# Patient Record
Sex: Female | Born: 1980 | Race: Black or African American | Hispanic: No | Marital: Single | Smoking: Never smoker
Health system: Southern US, Community
[De-identification: ages and names within clinical notes are randomized; demographics above are authoritative.]

## PROBLEM LIST (undated history)

## (undated) DIAGNOSIS — J45909 Unspecified asthma, uncomplicated: Secondary | ICD-10-CM

---

## 2015-10-23 ENCOUNTER — Emergency Department (HOSPITAL_COMMUNITY)
Admission: EM | Admit: 2015-10-23 | Discharge: 2015-10-23 | Disposition: A | Discharge status: Home / Self Care | Payer: BLUE CROSS/BLUE SHIELD | Attending: Emergency Medicine | Admitting: Emergency Medicine

## 2015-10-23 ENCOUNTER — Encounter (HOSPITAL_COMMUNITY): Payer: Self-pay | Admitting: Oncology

## 2015-10-23 ENCOUNTER — Encounter (HOSPITAL_COMMUNITY): Payer: Self-pay

## 2015-10-23 ENCOUNTER — Emergency Department (HOSPITAL_COMMUNITY)
Admission: EM | Admit: 2015-10-23 | Discharge: 2015-10-24 | Disposition: A | Discharge status: Home / Self Care | Payer: BLUE CROSS/BLUE SHIELD | Attending: Emergency Medicine | Admitting: Emergency Medicine

## 2015-10-23 DIAGNOSIS — M542 Cervicalgia: Secondary | ICD-10-CM | POA: Diagnosis present

## 2015-10-23 DIAGNOSIS — Y9389 Activity, other specified: Secondary | ICD-10-CM | POA: Insufficient documentation

## 2015-10-23 DIAGNOSIS — M546 Pain in thoracic spine: Secondary | ICD-10-CM

## 2015-10-23 DIAGNOSIS — Y999 Unspecified external cause status: Secondary | ICD-10-CM | POA: Diagnosis not present

## 2015-10-23 DIAGNOSIS — Y92410 Unspecified street and highway as the place of occurrence of the external cause: Secondary | ICD-10-CM | POA: Insufficient documentation

## 2015-10-23 DIAGNOSIS — J45909 Unspecified asthma, uncomplicated: Secondary | ICD-10-CM | POA: Diagnosis not present

## 2015-10-23 HISTORY — DX: Unspecified asthma, uncomplicated: J45.909

## 2015-10-23 MED ORDER — OXYCODONE-ACETAMINOPHEN 5-325 MG PO TABS
1.0000 | ORAL_TABLET | ORAL | Status: DC | PRN
  Filled 2015-10-23: qty 1
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
## 2015-10-24 DIAGNOSIS — M546 Pain in thoracic spine: Secondary | ICD-10-CM | POA: Diagnosis not present

## 2015-10-24 MED ORDER — METHOCARBAMOL 500 MG PO TABS: 500.0000 mg | ORAL_TABLET | Freq: Two times a day (BID) | ORAL | Status: DC

## 2015-10-24 MED ORDER — DIAZEPAM 5 MG PO TABS
5.0000 mg | ORAL_TABLET | Freq: Once | ORAL | Status: AC
  Administered 2015-10-24: 5 mg via ORAL
  Filled 2015-10-24: qty 1

## 2015-10-24 MED ORDER — KETOROLAC TROMETHAMINE 30 MG/ML IJ SOLN
30.0000 mg | Freq: Once | INTRAMUSCULAR | Status: AC
  Administered 2015-10-24: 30 mg via INTRAMUSCULAR
  Filled 2015-10-24: qty 1

## 2015-10-24 NOTE — ED Notes (Signed)
Pt reports understanding of discharge information. No questions at time of discharge 
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
## 2015-10-24 NOTE — Discharge Instructions (Signed)
Please take your medications as prescribed and as we discussed. Do not take this medication before driving or operating machinery as it can make you very tired. Continue taking your previously prescribed ibuprofen. Follow-up with your doctor as needed. Return to ED for new or worsening symptoms.  Thoracic Strain A thoracic strain, which is sometimes called a mid-back strain, is an injury to the muscles or tendons that attach to the upper part of your back behind your chest. This type of injury occurs when a muscle is overstretched or overloaded.  Thoracic strains can range from mild to severe. Mild strains may involve stretching a muscle or tendon without tearing it. These injuries may heal in 1-2 weeks. More severe strains involve tearing of muscle fibers or tendons. These will cause more pain and may take 6-8 weeks to heal. CAUSES This condition may be caused by:  An injury in which a sudden force is placed on the muscle.  Exercising without properly warming up.  Overuse of the muscle.  Improper form during certain movements.  Other injuries that surround or cause stress on the mid-back, causing a strain on the muscles. In some cases, the cause may not be known. RISK FACTORS This injury is more common in:  Athletes.  People with obesity. SYMPTOMS The main symptom of this condition is pain, especially with movement. Other symptoms include:  Bruising.  Swelling.  Spasm. DIAGNOSIS This condition may be diagnosed with a physical exam. X-rays may be taken to check for a fracture. TREATMENT This condition may be treated with:  Resting and icing the injured area.  Physical therapy. This will involve doing stretching and strengthening exercises.  Medicines for pain and inflammation. HOME CARE INSTRUCTIONS  Rest as needed. Follow instructions from your health care provider about any restrictions on activity.  If directed, apply ice to the injured area:  Put ice in a plastic  bag.  Place a towel between your skin and the bag.  Leave the ice on for 20 minutes, 2-3 times per day.  Take over-the-counter and prescription medicines only as told by your health care provider.  Begin doing exercises as told by your health care provider or physical therapist.  Always warm up properly before physical activity or sports.  Bend your knees before you lift heavy objects.  Keep all follow-up visits as told by your health care provider. This is important. SEEK MEDICAL CARE IF:  Your pain is not helped by medicine.  Your pain, bruising, or swelling is getting worse.  You have a fever. SEEK IMMEDIATE MEDICAL CARE IF:  You have shortness of breath.  You have chest pain.  You develop numbness or weakness in your legs.  You have involuntary loss of urine (urinary incontinence).   This information is not intended to replace advice given to you by your health care provider. Make sure you discuss any questions you have with your health care provider.   Document Released: 06/17/2003 Document Revised: 12/16/2014 Document Reviewed: 05/21/2014 Elsevier Interactive Patient Education [employer]2016 Elsevier Inc.

## 2017-05-30 ENCOUNTER — Other Ambulatory Visit: Payer: Self-pay

## 2017-05-30 ENCOUNTER — Ambulatory Visit (HOSPITAL_COMMUNITY)
Admission: EM | Admit: 2017-05-30 | Discharge: 2017-05-30 | Disposition: A | Discharge status: Home / Self Care | Payer: 59 | Attending: Family Medicine | Admitting: Family Medicine

## 2017-05-30 ENCOUNTER — Encounter (HOSPITAL_COMMUNITY): Payer: Self-pay

## 2017-05-30 DIAGNOSIS — K0889 Other specified disorders of teeth and supporting structures: Secondary | ICD-10-CM

## 2017-05-30 MED ORDER — AMOXICILLIN-POT CLAVULANATE 875-125 MG PO TABS: 1.0000 | ORAL_TABLET | Freq: Two times a day (BID) | ORAL | 0 refills | Status: DC

## 2017-05-30 MED ORDER — HYDROCODONE-ACETAMINOPHEN 5-325 MG PO TABS: 1.0000 | ORAL_TABLET | Freq: Four times a day (QID) | ORAL | 0 refills | Status: DC | PRN

## 2017-05-30 NOTE — Discharge Instructions (Signed)
Be aware, pain medications may cause drowsiness. Please do not drive, operate heavy machinery or make important decisions while on this medication, it can cloud your judgement.  

## 2017-05-30 NOTE — ED Triage Notes (Signed)
[facility], pt also complains of ear pain

## 2017-06-04 NOTE — ED Provider Notes (Signed)
  [facility]   [id number] 05/30/17 Arrival Time: 1858  ASSESSMENT & PLAN:  1. Pain, dental     Meds ordered this encounter  Medications  . amoxicillin-clavulanate (AUGMENTIN) 875-125 MG tablet    Sig: Take 1 tablet by mouth every 12 (twelve) hours.    Dispense:  20 tablet    Refill:  0  . HYDROcodone-acetaminophen (NORCO/VICODIN) 5-325 MG tablet    Sig: Take 1 tablet by mouth every 6 (six) hours as needed for moderate pain or severe pain.    Dispense:  8 tablet    Refill:  0   [place] Controlled Substances Registry consulted for this patient. I feel the risk/benefit ratio today is favorable for proceeding with this prescription for a controlled substance. Medication sedation precautions given.  Dental resource written instructions given. She will schedule dental evaluation as soon as possible.  Reviewed expectations re: course of current medical issues. Questions answered. Outlined signs and symptoms indicating need for more acute intervention. Patient verbalized understanding. After Visit Summary given.   SUBJECTIVE:  Savannah Ryan is a 37 y.o. female who reports gradual onset of left upper dental pain described as throbbing with sharp exacerbations. Present for approx 1 week. "Kind of feel it in my left ear also." Afebrile. Tolerating PO intake but reports pain with chewing. Normal swallowing. She does not see a dentist regularly. No neck swelling or pain. OTC analgesics without relief.  ROS: As per HPI.  OBJECTIVE:  Vitals:   05/30/17 1958 05/30/17 2000 05/30/17 2002  BP: (!) 163/102  (!) 154/93  Pulse: 68  69  Resp: 16  16  Temp: 98.3 F (36.8 C)    TempSrc: Oral    SpO2: 99% 99%     General appearance: alert; no distress HENT: normocephalic; atraumatic; dentition: fair; gingival hypertrophy over left upper gums without areas of fluctucance Neck: supple without LAD Lungs: normal respirations Skin: warm and dry Psychological: alert and cooperative; normal  mood and affect  No Known Allergies  Past Medical History:  Diagnosis Date  . Asthma    Social History   Socioeconomic History  . Marital status: Single    Spouse name: Not on file  . Number of children: Not on file  . Years of education: Not on file  . Highest education level: Not on file  Social Needs  . Financial resource strain: Not on file  . Food insecurity - worry: Not on file  . Food insecurity - inability: Not on file  . Transportation needs - medical: Not on file  . Transportation needs - non-medical: Not on file  Occupational History  . Not on file  Tobacco Use  . Smoking status: Never Smoker  . Smokeless tobacco: Never Used  Substance and Sexual Activity  . Alcohol use: Yes  . Drug use: No  . Sexual activity: No  Other Topics Concern  . Not on file  Social History Narrative  . Not on file   History reviewed. No pertinent surgical history.   Mardella LaymanHagler, Ameia Morency, MD 06/04/17 [phone]

## 2019-09-28 ENCOUNTER — Encounter (HOSPITAL_COMMUNITY): Payer: Self-pay | Admitting: Emergency Medicine

## 2019-09-28 ENCOUNTER — Ambulatory Visit (HOSPITAL_COMMUNITY)
Admission: AD | Admit: 2019-09-28 | Discharge: 2019-09-29 | Disposition: A | Discharge status: Home / Self Care | Payer: 59 | Attending: Obstetrics and Gynecology | Admitting: Obstetrics and Gynecology

## 2019-09-28 ENCOUNTER — Emergency Department (HOSPITAL_COMMUNITY): Payer: 59

## 2019-09-28 ENCOUNTER — Other Ambulatory Visit: Payer: Self-pay

## 2019-09-28 DIAGNOSIS — Z20822 Contact with and (suspected) exposure to covid-19: Secondary | ICD-10-CM | POA: Diagnosis not present

## 2019-09-28 DIAGNOSIS — O00102 Left tubal pregnancy without intrauterine pregnancy: Secondary | ICD-10-CM | POA: Diagnosis not present

## 2019-09-28 DIAGNOSIS — O99519 Diseases of the respiratory system complicating pregnancy, unspecified trimester: Secondary | ICD-10-CM | POA: Insufficient documentation

## 2019-09-28 DIAGNOSIS — Z3A00 Weeks of gestation of pregnancy not specified: Secondary | ICD-10-CM | POA: Insufficient documentation

## 2019-09-28 DIAGNOSIS — O009 Unspecified ectopic pregnancy without intrauterine pregnancy: Secondary | ICD-10-CM

## 2019-09-28 DIAGNOSIS — N83201 Unspecified ovarian cyst, right side: Secondary | ICD-10-CM | POA: Insufficient documentation

## 2019-09-28 DIAGNOSIS — J45909 Unspecified asthma, uncomplicated: Secondary | ICD-10-CM | POA: Diagnosis not present

## 2019-09-28 DIAGNOSIS — N939 Abnormal uterine and vaginal bleeding, unspecified: Secondary | ICD-10-CM | POA: Diagnosis present

## 2019-09-28 DIAGNOSIS — O3480 Maternal care for other abnormalities of pelvic organs, unspecified trimester: Secondary | ICD-10-CM | POA: Diagnosis not present

## 2019-09-28 DIAGNOSIS — R1032 Left lower quadrant pain: Secondary | ICD-10-CM | POA: Diagnosis present

## 2019-09-28 LAB — LIPASE, BLOOD: Lipase: 27 U/L (ref 11–51)

## 2019-09-28 LAB — COMPREHENSIVE METABOLIC PANEL
ALT: 12 U/L (ref 0–44)
AST: 21 U/L (ref 15–41)
Albumin: 4.1 g/dL (ref 3.5–5.0)
Alkaline Phosphatase: 52 U/L (ref 38–126)
Anion gap: 12 (ref 5–15)
BUN: 10 mg/dL (ref 6–20)
CO2: 22 mmol/L (ref 22–32)
Calcium: 8.9 mg/dL (ref 8.9–10.3)
Chloride: 103 mmol/L (ref 98–111)
Creatinine, Ser: 0.85 mg/dL (ref 0.44–1.00)
GFR calc Af Amer: >60 mL/min (ref >60)
GFR calc non Af Amer: >60 mL/min (ref >60)
Glucose, Bld: 80 mg/dL (ref 70–99)
Potassium: 3.5 mmol/L (ref 3.5–5.1)
Sodium: 137 mmol/L (ref 135–145)
Total Bilirubin: 1.1 mg/dL (ref 0.3–1.2)
Total Protein: 7.8 g/dL (ref 6.5–8.1)

## 2019-09-28 LAB — CBC
HCT: 35.8 % — ABNORMAL LOW (ref 36.0–46.0)
Hemoglobin: 12.1 g/dL (ref 12.0–15.0)
MCH: 31.3 pg (ref 26.0–34.0)
MCHC: 33.8 g/dL (ref 30.0–36.0)
MCV: 92.5 fL (ref 80.0–100.0)
Platelets: 253 10*3/uL (ref 150–400)
RBC: 3.87 MIL/uL (ref 3.87–5.11)
RDW: 12.4 % (ref 11.5–15.5)
WBC: 10.5 10*3/uL (ref 4.0–10.5)
nRBC: 0 % (ref 0.0–0.2)

## 2019-09-28 LAB — I-STAT BETA HCG BLOOD, ED (MC, WL, AP ONLY): I-stat hCG, quantitative: >2000 m[IU]/mL — ABNORMAL HIGH (ref <5)

## 2019-09-28 LAB — WET PREP, GENITAL
Clue Cells Wet Prep HPF POC: NONE SEEN
Sperm: NONE SEEN
Trich, Wet Prep: NONE SEEN
WBC, Wet Prep HPF POC: NONE SEEN
Yeast Wet Prep HPF POC: NONE SEEN

## 2019-09-28 LAB — HCG, QUANTITATIVE, PREGNANCY: hCG, Beta Chain, Quant, S: 4013 m[IU]/mL — ABNORMAL HIGH (ref <5)

## 2019-09-28 LAB — TYPE AND SCREEN
ABO/RH(D): A POS
Antibody Screen: NEGATIVE

## 2019-09-28 MED ORDER — MORPHINE SULFATE (PF) 4 MG/ML IV SOLN
4.0000 mg | Freq: Once | INTRAVENOUS | Status: AC
  Administered 2019-09-28: 4 mg via INTRAVENOUS
  Filled 2019-09-28: qty 1

## 2019-09-28 MED ORDER — SODIUM CHLORIDE 0.9 % IV BOLUS
1000.0000 mL | Freq: Once | INTRAVENOUS | Status: AC
  Administered 2019-09-28: 1000 mL via INTRAVENOUS

## 2019-09-28 NOTE — ED Provider Notes (Cosign Needed)
[facility]-EMERGENCY DEPT Provider Note   CSN: [id number] Arrival date & time: 09/28/19  1554     History Chief Complaint  Patient presents with  . Vaginal Bleeding  . Abdominal Pain    Savannah Ryan is a 39 y.o. female who presents to ED with a chief complaint of left lower quadrant abdominal pain and vaginal bleeding.  States that she started with her usual menstrual cycle on 08/30/2019. Since then she has had persistent vaginal bleeding that will be intermittently heavier.  States that this current episode is similar to spotting.  1 week ago started having left lower quadrant abdominal pain that has been intermittent without specific aggravating or alleviating factor.  She has been taking ibuprofen but has not noticed much difference.  She does not currently have an OB/GYN provider.  Last sexual intercourse was 2 months ago, she is not concerned about STDs. She denies any vaginal discharge, dysuria, anticoagulant use, syncope, OCP use, history of ovarian cysts, history of ectopic pregnancy. She does report some fatigue and lightheadedness when she has increase in bleeding.  HPI     Past Medical History:  Diagnosis Date  . Asthma     There are no problems to display for this patient.   History reviewed. No pertinent surgical history.   OB History   No obstetric history on file.     No family history on file.  Social History   Tobacco Use  . Smoking status: Never Smoker  . Smokeless tobacco: Never Used  Substance Use Topics  . Alcohol use: Yes  . Drug use: No    Home Medications Prior to Admission medications   Medication Sig Start Date End Date Taking? Authorizing Provider  amoxicillin-clavulanate (AUGMENTIN) 875-125 MG tablet Take 1 tablet by mouth every 12 (twelve) hours. 05/30/17   Mardella Layman, MD  HYDROcodone-acetaminophen (NORCO/VICODIN) 5-325 MG tablet Take 1 tablet by mouth every 6 (six) hours as needed for moderate pain or severe pain.  05/30/17   Mardella Layman, MD  methocarbamol (ROBAXIN) 500 MG tablet Take 1 tablet (500 mg total) by mouth 2 (two) times daily. 10/24/15   Joycie Peek, PA-C    Allergies    Patient has no known allergies.  Review of Systems   Review of Systems  Constitutional: Negative for appetite change, chills and fever.  HENT: Negative for ear pain, rhinorrhea, sneezing and sore throat.   Eyes: Negative for photophobia and visual disturbance.  Respiratory: Negative for cough, chest tightness, shortness of breath and wheezing.   Cardiovascular: Negative for chest pain and palpitations.  Gastrointestinal: Positive for abdominal pain. Negative for blood in stool, constipation, diarrhea, nausea and vomiting.  Genitourinary: Positive for pelvic pain and vaginal bleeding. Negative for dysuria, hematuria and urgency.  Musculoskeletal: Negative for myalgias.  Skin: Negative for rash.  Neurological: Negative for dizziness, weakness and light-headedness.    Physical Exam Updated Vital Signs BP 122/74 (BP Location: Left Arm)   Pulse 79   Temp 98.5 F (36.9 C)   Resp 18   LMP 08/30/2019   SpO2 100%   Physical Exam Vitals and nursing note reviewed. Exam conducted with a chaperone present.  Constitutional:      General: She is not in acute distress.    Appearance: She is well-developed.  HENT:     Head: Normocephalic and atraumatic.     Nose: Nose normal.  Eyes:     General: No scleral icterus.       Left eye: No  discharge.     Conjunctiva/sclera: Conjunctivae normal.  Cardiovascular:     Rate and Rhythm: Normal rate and regular rhythm.     Heart sounds: Normal heart sounds. No murmur heard.  No friction rub. No gallop.   Pulmonary:     Effort: Pulmonary effort is normal. No respiratory distress.     Breath sounds: Normal breath sounds.  Abdominal:     General: Bowel sounds are normal. There is no distension.     Palpations: Abdomen is soft.     Tenderness: There is abdominal tenderness  in the left lower quadrant. There is no guarding.  Genitourinary:    Vagina: Bleeding present.     Cervix: No cervical motion tenderness.     Uterus: Not enlarged and not tender.      Adnexa:        Right: Tenderness present.        Left: Tenderness present.      Comments: Pelvic exam: normal external genitalia without evidence of trauma. VULVA: normal appearing vulva with no masses, tenderness or lesion. VAGINA: normal appearing vagina with normal color and discharge; moderate amount of blood in vaginal vault; no lesions. CERVIX: normal appearing cervix without lesions, cervical motion tenderness absent, cervical os closed with out purulent discharge; No vaginal discharge. Wet prep and DNA probe for chlamydia and GC obtained.   ADNEXA: normal adnexa in size, TTP of the L adnexal area, mild tenderness in the R adnexal area. No masses palpated. UTERUS: uterus is normal size, shape, consistency and nontender.   Musculoskeletal:        General: Normal range of motion.     Cervical back: Normal range of motion and neck supple.  Skin:    General: Skin is warm and dry.     Findings: No rash.  Neurological:     Mental Status: She is alert.     Motor: No abnormal muscle tone.     Coordination: Coordination normal.     ED Results / Procedures / Treatments   Labs (all labs ordered are listed, but only abnormal results are displayed) Labs Reviewed  CBC - Abnormal; Notable for the following components:      Result Value   HCT 35.8 (*)    All other components within normal limits  WET PREP, GENITAL  LIPASE, BLOOD  COMPREHENSIVE METABOLIC PANEL  URINALYSIS, ROUTINE W REFLEX MICROSCOPIC  I-STAT BETA HCG BLOOD, ED ([facility], [facility], [facility] ONLY)  TYPE AND SCREEN  GC/CHLAMYDIA PROBE AMP ([place]) NOT AT [facility]    EKG None  Radiology No results found.  Procedures Procedures (including critical care time)  Medications Ordered in ED Medications  morphine 4 MG/ML injection 4 mg (4 mg  Intravenous Given 09/28/19 2034)  sodium chloride 0.9 % bolus 1,000 mL (1,000 mLs Intravenous New Bag/Given 09/28/19 2034)    ED Course  I have reviewed the triage vital signs and the nursing notes.  Pertinent labs & imaging results that were available during my care of the patient were reviewed by me and considered in my medical decision making (see chart for details).    MDM Rules/Calculators/A&P                          39 year old female presenting to the ED for persistent vaginal bleeding for the past month as well as left lower quadrant pain for the past week. States that bleeding will intermittently lighten and then increase.  Pain has been  intermittent. She does report some lightheadedness and fatigue when her bleeding worsens. She has tenderness palpation of the left lower quadrant on exam without rebound or guarding.  Will need to obtain lab work, complete pelvic exam and reassess. Will attempt to control pain and give IV fluids to help with lightheadedness.  Pelvic exam revealed bilateral adnexal tenderness L > R with a moderate amount of blood in vaginal vault. No CMT. Labwork pending. Will need pelvic u/s to r/o torsion or other structural cause of her symptoms. Consider hormonal tx to stop bleeding if indicated. Will need outpatient f/u if her workup is reassuring. Care handed off to oncoming provider pending remainder of workup.  Portions of this note were generated with Scientist, clinical (histocompatibility and immunogenetics). Dictation errors may occur despite best attempts at proofreading.   Final Clinical Impression(s) / ED Diagnoses Final diagnoses:  Abnormal vaginal bleeding    Rx / DC Orders ED Discharge Orders    None       Dietrich Pates, PA-C 09/28/19 2049

## 2019-09-28 NOTE — ED Triage Notes (Signed)
Patient reports LLQ pain with vaginal bleeding x2 weeks. Denies N/V/D. Denies urinary sx.

## 2019-09-28 NOTE — MAU Note (Signed)
Bleeding after cycle, and pain that felt like cramps about a week ago and it got worse and worse.

## 2019-09-28 NOTE — ED Provider Notes (Addendum)
Care assumed at shift change from [facility].  See her note for full HPI and work-up.  Briefly, patient presenting with 2 weeks of vaginal bleeding and left lower quadrant abdominal pain.  LMP 08/30/2019.  Pelvic exam with minor amount of blood.  Pending pregnancy test and labs.  Plan to order pelvic ultrasound.   Physical Exam  BP 127/70   Pulse 86   Temp 98.5 F (36.9 C)   Resp 16   LMP 08/30/2019   SpO2 100%   Physical Exam Vitals and nursing note reviewed.  Constitutional:      General: She is not in acute distress.    Appearance: She is well-developed. She is not ill-appearing.  HENT:     Head: Normocephalic and atraumatic.  Eyes:     Conjunctiva/sclera: Conjunctivae normal.  Cardiovascular:     Rate and Rhythm: Normal rate.  Pulmonary:     Effort: Pulmonary effort is normal.  Abdominal:     General: Bowel sounds are normal.     Palpations: Abdomen is soft.     Tenderness: There is abdominal tenderness. There is guarding. There is no rebound.       Comments: Tenderness is worse in the left lower quadrant.  Some guarding is noted.  Skin:    General: Skin is warm.  Neurological:     Mental Status: She is alert.  Psychiatric:        Behavior: Behavior normal.    Results for orders placed or performed during the hospital encounter of 09/28/19  Wet prep, genital   Specimen: PATH Cytology Cervicovaginal Ancillary Only  Result Value Ref Range   Yeast Wet Prep HPF POC NONE SEEN NONE SEEN   Trich, Wet Prep NONE SEEN NONE SEEN   Clue Cells Wet Prep HPF POC NONE SEEN NONE SEEN   WBC, Wet Prep HPF POC NONE SEEN NONE SEEN   Sperm NONE SEEN   Lipase, blood  Result Value Ref Range   Lipase 27 11 - 51 U/L  Comprehensive metabolic panel  Result Value Ref Range   Sodium 137 135 - 145 mmol/L   Potassium 3.5 3.5 - 5.1 mmol/L   Chloride 103 98 - 111 mmol/L   CO2 22 22 - 32 mmol/L   Glucose, Bld 80 70 - 99 mg/dL   BUN 10 6 - 20 mg/dL   Creatinine, Ser 0.85 0.44 - 1.00 mg/dL    Calcium 8.9 8.9 - 10.3 mg/dL   Total Protein 7.8 6.5 - 8.1 g/dL   Albumin 4.1 3.5 - 5.0 g/dL   AST 21 15 - 41 U/L   ALT 12 0 - 44 U/L   Alkaline Phosphatase 52 38 - 126 U/L   Total Bilirubin 1.1 0.3 - 1.2 mg/dL   GFR calc non Af Amer >60 >60 mL/min   GFR calc Af Amer >60 >60 mL/min   Anion gap 12 5 - 15  CBC  Result Value Ref Range   WBC 10.5 4.0 - 10.5 K/uL   RBC 3.87 3.87 - 5.11 MIL/uL   Hemoglobin 12.1 12.0 - 15.0 g/dL   HCT 35.8 (L) 36 - 46 %   MCV 92.5 80.0 - 100.0 fL   MCH 31.3 26.0 - 34.0 pg   MCHC 33.8 30.0 - 36.0 g/dL   RDW 12.4 11.5 - 15.5 %   Platelets 253 150 - 400 K/uL   nRBC 0.0 0.0 - 0.2 %  I-Stat beta hCG blood, ED  Result Value Ref Range  I-stat hCG, quantitative >2,000.0 (H) <5 mIU/mL   Comment 3           US OB Comp < 14 Wks  Result Date: 09/28/2019 CLINICAL DATA:  Initial evaluation for acute left lower quadrant pain, vaginal bleeding, early pregnancy. EXAM: OBSTETRIC <14 WK [place] AND TRANSVAGINAL OB US TECHNIQUE: Both transabdominal and transvaginal ultrasound examinations were performed for complete evaluation of the gestation as well as the maternal uterus, adnexal regions, and pelvic cul-de-sac. Transvaginal technique was performed to assess early pregnancy. COMPARISON:  None available. FINDINGS: Intrauterine gestational sac: Negative. Yolk sac:  Negative. Embryo:  Negative. Cardiac Activity: Negative. Subchorionic hemorrhage:  None visualized. Maternal uterus/adnexae: Right ovary within normal limits measuring 2.5 x 2.3 x 2.2 cm. There is a 1.2 x 1.4 x 1.3 cm complex hypoechoic cyst within the right ovary, consistent with a small corpus luteal cyst or possibly hemorrhagic cyst. In the left adnexa, there is a complex mass-like area measuring 8.9 x 4.6 x 7.5 cm. Area demonstrates heterogeneous echotexture with scattered areas of increased vascularity. Prominent echogenic material within this region likely reflects hemorrhage/clot. No definite discrete or defined  products of conception. Associated large volume complex free fluid present within the pelvis. Findings highly concerning for a possible ruptured left ectopic pregnancy position within the left adnexa. IMPRESSION: 1. No IUP. 2. 8.9 x 4.6 x 7.5 cm complex heterogeneous mass-like area within the left adnexa with large volume complex free fluid within the pelvis. Findings are highly concerning for possible ruptured ectopic pregnancy. 3. 1.4 cm complex right ovarian cyst, likely a small corpus luteal cyst and/or hemorrhagic cyst. Critical Value/emergent results were called by telephone at the time of interpretation on 09/28/2019 at 10:55 pm to provider [place] Tatelyn Vanhecke , who verbally acknowledged these results. A 6 Electronically Signed   By: Rise Mu M.D.   On: 09/28/2019 23:05   US OB Transvaginal  Result Date: 09/28/2019 CLINICAL DATA:  Initial evaluation for acute left lower quadrant pain, vaginal bleeding, early pregnancy. EXAM: OBSTETRIC <14 WK [place] AND TRANSVAGINAL OB US TECHNIQUE: Both transabdominal and transvaginal ultrasound examinations were performed for complete evaluation of the gestation as well as the maternal uterus, adnexal regions, and pelvic cul-de-sac. Transvaginal technique was performed to assess early pregnancy. COMPARISON:  None available. FINDINGS: Intrauterine gestational sac: Negative. Yolk sac:  Negative. Embryo:  Negative. Cardiac Activity: Negative. Subchorionic hemorrhage:  None visualized. Maternal uterus/adnexae: Right ovary within normal limits measuring 2.5 x 2.3 x 2.2 cm. There is a 1.2 x 1.4 x 1.3 cm complex hypoechoic cyst within the right ovary, consistent with a small corpus luteal cyst or possibly hemorrhagic cyst. In the left adnexa, there is a complex mass-like area measuring 8.9 x 4.6 x 7.5 cm. Area demonstrates heterogeneous echotexture with scattered areas of increased vascularity. Prominent echogenic material within this region likely reflects hemorrhage/clot.  No definite discrete or defined products of conception. Associated large volume complex free fluid present within the pelvis. Findings highly concerning for a possible ruptured left ectopic pregnancy position within the left adnexa. IMPRESSION: 1. No IUP. 2. 8.9 x 4.6 x 7.5 cm complex heterogeneous mass-like area within the left adnexa with large volume complex free fluid within the pelvis. Findings are highly concerning for possible ruptured ectopic pregnancy. 3. 1.4 cm complex right ovarian cyst, likely a small corpus luteal cyst and/or hemorrhagic cyst. Critical Value/emergent results were called by telephone at the time of interpretation on 09/28/2019 at 10:55 pm to provider [place] Aedyn Mckeon , who verbally acknowledged  these results. A 6 Electronically Signed   By: Rise Mu M.D.   On: 09/28/2019 23:05    ED Course/Procedures   Clinical Course as of Sep 28 2307  Sun Sep 28, 2019  2146 Pt evaluated and made aware of positive pregnancy. LMP end of may, last sexually active in April. Pt is currently G6P3   [JR]  2253 Per radiologist, pt has likely ruptured ectopic on left. No IUP.    [JR]  2255 Dr. Alysia Penna with OB consulted and accepting transfer to [facility]. Pt made aware of results and agreeable with plan for transfer. All questions answered to the best of my ability. Pt's pain is well controlled at this time.    [JR]    Clinical Course User Index [JR] Taryll Reichenberger, [place] N, PA-C    .Critical Care Performed by: Kydan Shanholtzer, [place] N, PA-C Authorized by: Aliana Kreischer, [place] N, PA-C   Critical care provider statement:    Critical care time (minutes):  35   Critical care time was exclusive of:  Separately billable procedures and treating other patients and teaching time   Critical care was necessary to treat or prevent imminent or life-threatening deterioration of the following conditions:  Shock (ruptured ectopic pregnancy)   Critical care was time spent personally by me on the following  activities:  Discussions with consultants, evaluation of patient's response to treatment, examination of patient, ordering and performing treatments and interventions, ordering and review of laboratory studies, ordering and review of radiographic studies, pulse oximetry, re-evaluation of patient's condition, obtaining history from patient or surrogate and review of old charts    MDM  hCG is positive.  Will add on hCG quant and ABO Rh.  Patient states she has had 5 pregnancies, 3 with full-term uncomplicated pregnancies.  She is also had a spontaneous abortion and elective abortion.  She is sexually active, last intercourse was in April.  Ultrasound ordered.  Ultrasound with findings consistent with ruptured ectopic on the left.  Consulted with OB specialist on-call, Dr. Elroy Channel, who is accepting transfer.  Patient remains hemodynamically stable at this time, pain is well controlled.  Hgb 12.1. She is overall well-appearing.  Patient is stable for transfer to the MAU for further management.      Nancye Grumbine, [place] N, PA-C 09/28/19 2311    Mckinzi Eriksen, [place] N, PA-C 09/28/19 2314    Cathren Laine, MD 09/30/19 [phone]

## 2019-09-28 NOTE — Discharge Instructions (Addendum)
Laparoscopic Tubal Removal for Ectopic Pregnancy Discharge Instructions Laparoscopic tubal removal is a procedure that removes the fallopian tube containing the ectopic pregnancy. RISKS AND COMPLICATIONS   Infection.  Bleeding.  Injury to surrounding organs.  Anesthetic side effects.  Failure of the procedure.  Risks of future ectopic pregnancy on the other side PROCEDURE   You may be given a medicine to help you relax (sedative) before the procedure. You will be given a medicine to make you sleep (general anesthetic) during the procedure.  A tube will be put down your throat to help your breath while under general anesthesia.  Two small cuts (incisions) are made in the lower abdominal area and one incision is made near the belly button.  Your abdominal area will be inflated with a safe gas (carbon dioxide). This helps give the surgeon room to operate, visualize, and helps the surgeon avoid other organs.  A thin, lighted tube (laparoscope) with a camera attached is inserted into your abdomen through the incision near the belly button. Other small instruments are also inserted through the other abdominal incisions.  The fallopian tube is located and are removed.  After the fallopian tube is removed, the gas is released from the abdomen.  The incisions will be closed with stitches (sutures), and Dermabond. A bandage may be placed over the incisions. AFTER THE PROCEDURE   You will also have some mild abdominal discomfort for 3-7 days. You will be given pain medicine to ease any discomfort.  As long as there are no problems, you may be allowed to go home. Someone will need to drive you home and be with you for at least 24 hours once home.  You may have some mild discomfort in the throat. This is from the tube placed in your throat while you were sleeping.  You may experience discomfort in the shoulder area from some trapped air between the liver and diaphragm. This sensation is  normal and will slowly go away on its own. HOME CARE INSTRUCTIONS   Take all medicines as directed.  Only take over-the-counter or prescription medicines for pain, discomfort, or fever as directed by your caregiver.  Resume daily activities as directed.  Showers are preferred over baths.  You may resume sexual activities in 1 week or as directed.  Do not drive while taking narcotics. SEEK MEDICAL CARE IF: .  There is increasing abdominal pain.  You feel lightheaded or faint.  You have the chills.  You have an oral temperature above 102 F (38.9 C).  There is pus-like (purulent) drainage from any of the wounds.  You are unable to pass gas or have a bowel movement.  You feel sick to your stomach (nauseous) or throw up (vomit). MAKE SURE YOU:   Understand these instructions.  Will watch your condition.  Will get help right away if you are not doing well or get worse.  ExitCare Patient Information 2013 [employer].     

## 2019-09-29 ENCOUNTER — Inpatient Hospital Stay (HOSPITAL_COMMUNITY): Payer: 59 | Admitting: Certified Registered"

## 2019-09-29 ENCOUNTER — Encounter (HOSPITAL_COMMUNITY): Admission: AD | Disposition: A | Discharge status: Home / Self Care | Payer: Self-pay | Source: Home / Self Care

## 2019-09-29 ENCOUNTER — Other Ambulatory Visit: Payer: Self-pay

## 2019-09-29 ENCOUNTER — Encounter (HOSPITAL_COMMUNITY): Payer: Self-pay | Admitting: Obstetrics and Gynecology

## 2019-09-29 DIAGNOSIS — O00102 Left tubal pregnancy without intrauterine pregnancy: Secondary | ICD-10-CM

## 2019-09-29 DIAGNOSIS — O009 Unspecified ectopic pregnancy without intrauterine pregnancy: Secondary | ICD-10-CM

## 2019-09-29 HISTORY — PX: LAPAROSCOPY: SHX197

## 2019-09-29 LAB — TYPE AND SCREEN
ABO/RH(D): A POS
Antibody Screen: NEGATIVE

## 2019-09-29 LAB — ABO/RH
ABO/RH(D): A POS
ABO/RH(D): A POS

## 2019-09-29 LAB — SARS CORONAVIRUS 2 BY RT PCR (HOSPITAL ORDER, PERFORMED IN ~~LOC~~ HOSPITAL LAB): SARS Coronavirus 2: NEGATIVE

## 2019-09-29 SURGERY — LAPAROSCOPY, DIAGNOSTIC
Anesthesia: General | Site: Abdomen

## 2019-09-29 MED ORDER — GLYCOPYRROLATE 0.2 MG/ML IJ SOLN
INTRAMUSCULAR | Status: DC | PRN
Start: 2019-09-29 — End: 2019-09-29
  Administered 2019-09-29: .2 mg via INTRAVENOUS

## 2019-09-29 MED ORDER — OXYCODONE HCL 5 MG PO TABS: 5.0000 mg | ORAL_TABLET | Freq: Once | ORAL | Status: DC | PRN

## 2019-09-29 MED ORDER — LIDOCAINE 2% (20 MG/ML) 5 ML SYRINGE
INTRAMUSCULAR | Status: AC
  Filled 2019-09-29: qty 5

## 2019-09-29 MED ORDER — SUFENTANIL CITRATE 50 MCG/ML IV SOLN
INTRAVENOUS | Status: AC
  Filled 2019-09-29: qty 1

## 2019-09-29 MED ORDER — OXYCODONE HCL 5 MG PO TABS: 5.0000 mg | ORAL_TABLET | Freq: Four times a day (QID) | ORAL | 0 refills | Status: DC | PRN

## 2019-09-29 MED ORDER — MIDAZOLAM HCL 2 MG/2ML IJ SOLN
INTRAMUSCULAR | Status: DC | PRN
  Administered 2019-09-29: 2 mg via INTRAVENOUS

## 2019-09-29 MED ORDER — SOD CITRATE-CITRIC ACID 500-334 MG/5ML PO SOLN: 30.0000 mL | ORAL | Status: DC

## 2019-09-29 MED ORDER — ROCURONIUM 10MG/ML (10ML) SYRINGE FOR MEDFUSION PUMP - OPTIME
INTRAVENOUS | Status: DC | PRN
  Administered 2019-09-29 (×2): 20 mg via INTRAVENOUS

## 2019-09-29 MED ORDER — SUGAMMADEX SODIUM 200 MG/2ML IV SOLN
INTRAVENOUS | Status: DC | PRN
Start: 2019-09-29 — End: 2019-09-29
  Administered 2019-09-29: 200 mg via INTRAVENOUS

## 2019-09-29 MED ORDER — POVIDONE-IODINE 10 % EX SWAB
2.0000 "application " | Freq: Once | CUTANEOUS | Status: AC
  Administered 2019-09-29: 2 via TOPICAL

## 2019-09-29 MED ORDER — DEXAMETHASONE SODIUM PHOSPHATE 10 MG/ML IJ SOLN
INTRAMUSCULAR | Status: DC | PRN
  Administered 2019-09-29: 10 mg via INTRAVENOUS

## 2019-09-29 MED ORDER — LACTATED RINGERS IV SOLN: INTRAVENOUS | Status: DC

## 2019-09-29 MED ORDER — MIDAZOLAM HCL 2 MG/2ML IJ SOLN
INTRAMUSCULAR | Status: AC
  Filled 2019-09-29: qty 2

## 2019-09-29 MED ORDER — ACETAMINOPHEN 500 MG PO TABS: 1000.0000 mg | ORAL_TABLET | ORAL | Status: DC

## 2019-09-29 MED ORDER — KETOROLAC TROMETHAMINE 15 MG/ML IJ SOLN: 15.0000 mg | INTRAMUSCULAR | Status: DC

## 2019-09-29 MED ORDER — SOD CITRATE-CITRIC ACID 500-334 MG/5ML PO SOLN
ORAL | Status: AC
  Administered 2019-09-29: 30 mL
  Filled 2019-09-29: qty 30

## 2019-09-29 MED ORDER — BUPIVACAINE HCL (PF) 0.5 % IJ SOLN
INTRAMUSCULAR | Status: AC
  Filled 2019-09-29: qty 30

## 2019-09-29 MED ORDER — ONDANSETRON HCL 4 MG/2ML IJ SOLN
INTRAMUSCULAR | Status: AC
  Filled 2019-09-29: qty 2

## 2019-09-29 MED ORDER — ONDANSETRON HCL 4 MG/2ML IJ SOLN
INTRAMUSCULAR | Status: DC | PRN
  Administered 2019-09-29: 4 mg via INTRAVENOUS

## 2019-09-29 MED ORDER — ROCURONIUM BROMIDE 10 MG/ML (PF) SYRINGE
PREFILLED_SYRINGE | INTRAVENOUS | Status: AC
  Filled 2019-09-29: qty 10

## 2019-09-29 MED ORDER — SODIUM CHLORIDE (PF) 0.9 % IJ SOLN
INTRAMUSCULAR | Status: AC
  Filled 2019-09-29: qty 10

## 2019-09-29 MED ORDER — OXYCODONE HCL 5 MG/5ML PO SOLN: 5.0000 mg | Freq: Once | ORAL | Status: DC | PRN

## 2019-09-29 MED ORDER — PROPOFOL 10 MG/ML IV BOLUS
INTRAVENOUS | Status: AC
  Filled 2019-09-29: qty 20

## 2019-09-29 MED ORDER — IBUPROFEN 800 MG PO TABS
800.0000 mg | ORAL_TABLET | Freq: Three times a day (TID) | ORAL | 0 refills | Status: DC | PRN
Start: 2019-09-29 — End: 2021-05-18

## 2019-09-29 MED ORDER — LACTATED RINGERS IV SOLN
INTRAVENOUS | Status: DC | PRN
Start: 2019-09-29 — End: 2019-09-29

## 2019-09-29 MED ORDER — SUCCINYLCHOLINE CHLORIDE 200 MG/10ML IV SOSY
PREFILLED_SYRINGE | INTRAVENOUS | Status: DC | PRN
  Administered 2019-09-29: 100 mg via INTRAVENOUS

## 2019-09-29 MED ORDER — FENTANYL CITRATE (PF) 100 MCG/2ML IJ SOLN: 25.0000 ug | INTRAMUSCULAR | Status: DC | PRN

## 2019-09-29 MED ORDER — DEXAMETHASONE SODIUM PHOSPHATE 10 MG/ML IJ SOLN
INTRAMUSCULAR | Status: AC
  Filled 2019-09-29: qty 1

## 2019-09-29 MED ORDER — PHENYLEPHRINE 40 MCG/ML (10ML) SYRINGE FOR IV PUSH (FOR BLOOD PRESSURE SUPPORT)
PREFILLED_SYRINGE | INTRAVENOUS | Status: AC
  Filled 2019-09-29: qty 10

## 2019-09-29 MED ORDER — BUPIVACAINE HCL 0.5 % IJ SOLN
INTRAMUSCULAR | Status: DC | PRN
  Administered 2019-09-29: 30 mL

## 2019-09-29 MED ORDER — SUCCINYLCHOLINE CHLORIDE 200 MG/10ML IV SOSY
PREFILLED_SYRINGE | INTRAVENOUS | Status: AC
  Filled 2019-09-29: qty 10

## 2019-09-29 MED ORDER — ONDANSETRON HCL 4 MG/2ML IJ SOLN: 4.0000 mg | Freq: Four times a day (QID) | INTRAMUSCULAR | Status: DC | PRN

## 2019-09-29 MED ORDER — PROPOFOL 10 MG/ML IV BOLUS
INTRAVENOUS | Status: DC | PRN
  Administered 2019-09-29: 170 mg via INTRAVENOUS
  Administered 2019-09-29: 30 mg via INTRAVENOUS

## 2019-09-29 MED ORDER — SUFENTANIL CITRATE 50 MCG/ML IV SOLN
INTRAVENOUS | Status: DC | PRN
  Administered 2019-09-29: 10 ug via INTRAVENOUS
  Administered 2019-09-29: 20 ug via INTRAVENOUS

## 2019-09-29 MED ORDER — SODIUM CHLORIDE 0.9 % IR SOLN
Status: DC | PRN
  Administered 2019-09-29: 3000 mL

## 2019-09-29 MED ORDER — LIDOCAINE 2% (20 MG/ML) 5 ML SYRINGE
INTRAMUSCULAR | Status: DC | PRN
  Administered 2019-09-29: 100 mg via INTRAVENOUS

## 2019-09-29 MED ORDER — GLYCOPYRROLATE PF 0.2 MG/ML IJ SOSY
PREFILLED_SYRINGE | INTRAMUSCULAR | Status: AC
  Filled 2019-09-29: qty 1

## 2019-09-29 SURGICAL SUPPLY — 37 items
APPLICATOR ARISTA FLEXITIP XL (MISCELLANEOUS) IMPLANT
COVER WAND RF STERILE (DRAPES) ×3 IMPLANT
DERMABOND ADVANCED (GAUZE/BANDAGES/DRESSINGS) ×2
DERMABOND ADVANCED .7 DNX12 (GAUZE/BANDAGES/DRESSINGS) ×1 IMPLANT
DRSG OPSITE POSTOP 3X4 (GAUZE/BANDAGES/DRESSINGS) IMPLANT
DURAPREP 26ML APPLICATOR (WOUND CARE) ×3 IMPLANT
GLOVE BIO SURGEON STRL SZ7.5 (GLOVE) ×3 IMPLANT
GLOVE BIOGEL PI IND STRL 7.0 (GLOVE) ×2 IMPLANT
GLOVE BIOGEL PI IND STRL 8 (GLOVE) ×2 IMPLANT
GLOVE BIOGEL PI INDICATOR 7.0 (GLOVE) ×4
GLOVE BIOGEL PI INDICATOR 8 (GLOVE) ×4
GLOVE NEODERM STER SZ 7 (GLOVE) ×3 IMPLANT
GOWN STRL REUS W/ TWL LRG LVL3 (GOWN DISPOSABLE) ×1 IMPLANT
GOWN STRL REUS W/ TWL XL LVL3 (GOWN DISPOSABLE) ×1 IMPLANT
GOWN STRL REUS W/TWL LRG LVL3 (GOWN DISPOSABLE) ×2
GOWN STRL REUS W/TWL XL LVL3 (GOWN DISPOSABLE) ×2
HEMOSTAT ARISTA ABSORB 3G PWDR (HEMOSTASIS) IMPLANT
KIT TURNOVER KIT B (KITS) ×3 IMPLANT
NDL INSUFF ACCESS 14 VERSASTEP (NEEDLE) IMPLANT
NS IRRIG 1000ML POUR BTL (IV SOLUTION) ×3 IMPLANT
PACK LAPAROSCOPY BASIN (CUSTOM PROCEDURE TRAY) ×3 IMPLANT
PACK TRENDGUARD 450 HYBRID PRO (MISCELLANEOUS) ×1 IMPLANT
PROTECTOR NERVE ULNAR (MISCELLANEOUS) ×6 IMPLANT
SET IRRIG TUBING LAPAROSCOPIC (IRRIGATION / IRRIGATOR) ×3 IMPLANT
SET TUBE SMOKE EVAC HIGH FLOW (TUBING) ×3 IMPLANT
SHEARS HARMONIC ACE PLUS 36CM (ENDOMECHANICALS) ×3 IMPLANT
SLEEVE ENDOPATH XCEL 5M (ENDOMECHANICALS) ×3 IMPLANT
SUT VICRYL 0 UR6 27IN ABS (SUTURE) ×3 IMPLANT
SUT VICRYL 4-0 PS2 18IN ABS (SUTURE) ×3 IMPLANT
SYR 30ML SLIP (SYRINGE) ×3 IMPLANT
TOWEL GREEN STERILE FF (TOWEL DISPOSABLE) ×6 IMPLANT
TRAY FOLEY W/BAG SLVR 14FR (SET/KITS/TRAYS/PACK) ×3 IMPLANT
TRENDGUARD 450 HYBRID PRO PACK (MISCELLANEOUS) ×3
TROCAR BALLN 12MMX100 BLUNT (TROCAR) ×3 IMPLANT
TROCAR VERSASTEP PLUS 12MM (TROCAR) IMPLANT
TROCAR XCEL NON-BLD 5MMX100MML (ENDOMECHANICALS) ×3 IMPLANT
WARMER LAPAROSCOPE (MISCELLANEOUS) ×3 IMPLANT

## 2019-09-29 NOTE — Op Note (Signed)
Savannah Ryan PROCEDURE DATE: 09/29/2019  PREOPERATIVE DIAGNOSIS: Ruptured ectopic pregnancy POSTOPERATIVE DIAGNOSIS: Ruptured left fallopian tube ectopic pregnancy PROCEDURE: Laparoscopic left salpingectomy and removal of ectopic pregnancy SURGEON:  Dr. Nettie Elm ANESTHESIOLOGIST: Achille Rich, MD Anesthesiologist: Achille Rich, MD CRNA: Melina Schools, CRNA  INDICATIONS: 39 y.o. [phone] at Unknown here with the preoperative diagnoses as listed above.  Please refer to preoperative notes for more details. Patient was counseled regarding need for laparoscopic salpingectomy. Risks of surgery including bleeding which may require transfusion or reoperation, infection, injury to bowel or other surrounding organs, need for additional procedures including laparotomy and other postoperative/anesthesia complications were explained to patient.  Written informed consent was obtained.  FINDINGS:  200 cc amount of hemoperitoneum estimated to be about 200cc of blood and clots.  Dilated left fallopian tube containing ectopic gestation. Small normal appearing uterus, normal right fallopian tube, right ovary and left ovary with hemorraghic cyst  ANESTHESIA: General INTRAVENOUS FLUIDS: As recorded ESTIMATED BLOOD LOSS: 25 ml URINE OUTPUT: As recorded SPECIMENS: Left fallopian tube containing ectopic gestation COMPLICATIONS: None immediate  PROCEDURE IN DETAIL:  The patient was taken to the operating room where general anesthesia was administered and was found to be adequate.  She was placed in the dorsal lithotomy position, and was prepped and draped in a sterile manner.  A Foley catheter was inserted into her bladder and attached to constant drainage and a uterine manipulator was then advanced into the uterus .    After an adequate timeout was performed, attention was turned to the abdomen where an umbilical incision was made with the scalpel. Fascia was exposed with S retractors and grasped with Kocher  clamps. Fascia was cut with Mayo . Peritoneum was grasped with tonsil clamp and cut with Metzenbaum . The connors of the Fascia were then secured with 0 Vicryl.  A 12 -mm Excel Hessian trochar with sleeve was then placed without difficulty and secured with connors Fascia sutures.  The abdomen was then insufflated with carbon dioxide gas and adequate pneumoperitoneum was obtained.  A survey of the patient's pelvis and abdomen revealed the findings above.  Two 5-mm right lower quadrant ports were then placed under direct visualization.  The Nezhat suction irrigator was then used to suction the hemoperitoneum and irrigate the pelvis.  Attention was then turned to the right fallopian tube which was grasped and ligated from the underlying mesosalpinx and uterine attachment using the Harmonic instrument.  Good hemostasis was noted.  The specimen was placed in an EndoCatch bag and removed from the abdomen intact.  The abdomen was irrigated and blood clots removed. Inspection of all ligated sites revealed good hemostasis. The abdomen was desufflated, and all instruments were removed.  The fascial incision was closed with 0 Vicryl an interrupted suture of 3-0 Vicryl was used to closed the subcutaneous space.The umbilical incision was closed with 4-0 Vicryl and the lateral incisions were secured with Dermabond. The uterine manipulator and foley cathter was removed.  The patient tolerated the procedure well.  All instruments, needles, and sponge counts were correct x 2. The patient was taken to the recovery room in stable condition.   The patient will be discharged to home as per PACU criteria.  Routine postoperative instructions given.  She was prescribed Percocet and Ibuprofen   She will follow up in the clinic in about 2-3 weeks for postoperative evaluation.  Nettie Elm, MD, FACOG Attending Obstetrician & Gynecologist Faculty Practice, [facility]

## 2019-09-29 NOTE — Transfer of Care (Signed)
Immediate Anesthesia Transfer of Care Note  Patient: Savannah Ryan  Procedure(s) Performed: LAPAROSCOPY DIAGNOSTIC,  Left Salpingectomy with Removal Ectopic Pregnancy (N/A Abdomen)  Patient Location: PACU  Anesthesia Type:General  Level of Consciousness: oriented, drowsy, patient cooperative and responds to stimulation  Airway & Oxygen Therapy: Patient Spontanous Breathing and Patient connected to nasal cannula oxygen  Post-op Assessment: Report given to RN, Post -op Vital signs reviewed and stable and Patient moving all extremities X 4  Post vital signs: Reviewed and stable  Last Vitals:  Vitals Value Taken Time  BP    Temp    Pulse 73 09/29/19 0322  Resp 15 09/29/19 0322  SpO2 99 % 09/29/19 0322  Vitals shown include unvalidated device data.  Last Pain:  Vitals:   09/28/19 2350  TempSrc:   PainSc: 3          Complications: No complications documented.

## 2019-09-29 NOTE — H&P (Signed)
Savannah Ryan is an 39 y.O.[id number] female who presented to [facility] ER with abd pain. W/U included U/S which was concerning for possible ruptured left ectopic pregnancy.  Denies chronic medical problems or medications.  TSVD x 3  SAB x 2  Menstrual History: Menarche age: 20 Patient's last menstrual period was 08/30/2019.    Past Medical History:  Diagnosis Date  . Asthma    Pt. does not take asthma meds, does not have a rescue inhalaer     History reviewed. No pertinent surgical history.  Family History  Problem Relation Age of Onset  . Diabetes Maternal Grandmother     Social History:  reports that she has never smoked. She has never used smokeless tobacco. She reports current alcohol use. She reports that she does not use drugs.  Allergies: No Known Allergies  Medications Prior to Admission  Medication Sig Dispense Refill Last Dose  . amoxicillin-clavulanate (AUGMENTIN) 875-125 MG tablet Take 1 tablet by mouth every 12 (twelve) hours. 20 tablet 0 Unknown at Unknown time  . HYDROcodone-acetaminophen (NORCO/VICODIN) 5-325 MG tablet Take 1 tablet by mouth every 6 (six) hours as needed for moderate pain or severe pain. 8 tablet 0 Unknown at Unknown time  . methocarbamol (ROBAXIN) 500 MG tablet Take 1 tablet (500 mg total) by mouth 2 (two) times daily. 20 tablet 0 Unknown at Unknown time    Review of Systems  Constitutional: Negative.   Gastrointestinal: Positive for abdominal pain.  Genitourinary: Positive for pelvic pain and vaginal bleeding.    Blood pressure 126/64, pulse 81, temperature 98.1 F (36.7 C), temperature source Oral, resp. rate 16, last menstrual period 08/30/2019, SpO2 100 %. Physical Exam  Constitutional: She appears well-developed.  GI: Soft. Bowel sounds are normal. There is generalized abdominal tenderness.  Genitourinary:    Genitourinary Comments: Deferred to OR   Neurological: She is alert.    Results for orders placed or performed during the hospital  encounter of 09/28/19 (from the past 24 hour(s))  Lipase, blood     Status: None   Collection Time: 09/28/19  8:22 PM  Result Value Ref Range   Lipase 27 11 - 51 U/L  Comprehensive metabolic panel     Status: None   Collection Time: 09/28/19  8:22 PM  Result Value Ref Range   Sodium 137 135 - 145 mmol/L   Potassium 3.5 3.5 - 5.1 mmol/L   Chloride 103 98 - 111 mmol/L   CO2 22 22 - 32 mmol/L   Glucose, Bld 80 70 - 99 mg/dL   BUN 10 6 - 20 mg/dL   Creatinine, Ser 4.13 0.44 - 1.00 mg/dL   Calcium 8.9 8.9 - 24.4 mg/dL   Total Protein 7.8 6.5 - 8.1 g/dL   Albumin 4.1 3.5 - 5.0 g/dL   AST 21 15 - 41 U/L   ALT 12 0 - 44 U/L   Alkaline Phosphatase 52 38 - 126 U/L   Total Bilirubin 1.1 0.3 - 1.2 mg/dL   GFR calc non Af Amer >60 >60 mL/min   GFR calc Af Amer >60 >60 mL/min   Anion gap 12 5 - 15  CBC     Status: Abnormal   Collection Time: 09/28/19  8:22 PM  Result Value Ref Range   WBC 10.5 4.0 - 10.5 K/uL   RBC 3.87 3.87 - 5.11 MIL/uL   Hemoglobin 12.1 12.0 - 15.0 g/dL   HCT 01.0 (L) 36 - 46 %   MCV 92.5 80.0 - 100.0 fL  MCH 31.3 26.0 - 34.0 pg   MCHC 33.8 30.0 - 36.0 g/dL   RDW 39.0 30.0 - 92.3 %   Platelets 253 150 - 400 K/uL   nRBC 0.0 0.0 - 0.2 %  Type and screen [facility]     Status: None   Collection Time: 09/28/19  8:22 PM  Result Value Ref Range   ABO/RH(D) A POS    Antibody Screen NEG    Sample Expiration      10/01/2019,2359 Performed at [facility], [address], [place], [place] [address]   Wet prep, genital     Status: None   Collection Time: 09/28/19  8:22 PM   Specimen: PATH Cytology Cervicovaginal Ancillary Only  Result Value Ref Range   Yeast Wet Prep HPF POC NONE SEEN NONE SEEN   Trich, Wet Prep NONE SEEN NONE SEEN   Clue Cells Wet Prep HPF POC NONE SEEN NONE SEEN   WBC, Wet Prep HPF POC NONE SEEN NONE SEEN   Sperm NONE SEEN   hCG, quantitative, pregnancy     Status: Abnormal   Collection Time: 09/28/19  8:22  PM  Result Value Ref Range   hCG, Beta Chain, Quant, S 4,013 (H) <5 mIU/mL  ABO/Rh     Status: None (Preliminary result)   Collection Time: 09/28/19  8:22 PM  Result Value Ref Range   ABO/RH(D)      A POS Performed at [facility], [address], [place], [place] [address]   I-Stat beta hCG blood, ED     Status: Abnormal   Collection Time: 09/28/19  8:59 PM  Result Value Ref Range   I-stat hCG, quantitative >2,000.0 (H) <5 mIU/mL   Comment 3            US OB Comp < 14 Wks  Result Date: 09/28/2019 CLINICAL DATA:  Initial evaluation for acute left lower quadrant pain, vaginal bleeding, early pregnancy. EXAM: OBSTETRIC <14 WK [place] AND TRANSVAGINAL OB US TECHNIQUE: Both transabdominal and transvaginal ultrasound examinations were performed for complete evaluation of the gestation as well as the maternal uterus, adnexal regions, and pelvic cul-de-sac. Transvaginal technique was performed to assess early pregnancy. COMPARISON:  None available. FINDINGS: Intrauterine gestational sac: Negative. Yolk sac:  Negative. Embryo:  Negative. Cardiac Activity: Negative. Subchorionic hemorrhage:  None visualized. Maternal uterus/adnexae: Right ovary within normal limits measuring 2.5 x 2.3 x 2.2 cm. There is a 1.2 x 1.4 x 1.3 cm complex hypoechoic cyst within the right ovary, consistent with a small corpus luteal cyst or possibly hemorrhagic cyst. In the left adnexa, there is a complex mass-like area measuring 8.9 x 4.6 x 7.5 cm. Area demonstrates heterogeneous echotexture with scattered areas of increased vascularity. Prominent echogenic material within this region likely reflects hemorrhage/clot. No definite discrete or defined products of conception. Associated large volume complex free fluid present within the pelvis. Findings highly concerning for a possible ruptured left ectopic pregnancy position within the left adnexa. IMPRESSION: 1. No IUP. 2. 8.9 x 4.6 x 7.5 cm complex heterogeneous  mass-like area within the left adnexa with large volume complex free fluid within the pelvis. Findings are highly concerning for possible ruptured ectopic pregnancy. 3. 1.4 cm complex right ovarian cyst, likely a small corpus luteal cyst and/or hemorrhagic cyst. Critical Value/emergent results were called by telephone at the time of interpretation on 09/28/2019 at 10:55 pm to provider [place] ROBINSON , who verbally acknowledged these results. A 6 Electronically Signed  By: Jeannine Boga M.D.   On: 09/28/2019 23:05   US OB Transvaginal  Result Date: 09/28/2019 CLINICAL DATA:  Initial evaluation for acute left lower quadrant pain, vaginal bleeding, early pregnancy. EXAM: OBSTETRIC <14 WK [place] AND TRANSVAGINAL OB US TECHNIQUE: Both transabdominal and transvaginal ultrasound examinations were performed for complete evaluation of the gestation as well as the maternal uterus, adnexal regions, and pelvic cul-de-sac. Transvaginal technique was performed to assess early pregnancy. COMPARISON:  None available. FINDINGS: Intrauterine gestational sac: Negative. Yolk sac:  Negative. Embryo:  Negative. Cardiac Activity: Negative. Subchorionic hemorrhage:  None visualized. Maternal uterus/adnexae: Right ovary within normal limits measuring 2.5 x 2.3 x 2.2 cm. There is a 1.2 x 1.4 x 1.3 cm complex hypoechoic cyst within the right ovary, consistent with a small corpus luteal cyst or possibly hemorrhagic cyst. In the left adnexa, there is a complex mass-like area measuring 8.9 x 4.6 x 7.5 cm. Area demonstrates heterogeneous echotexture with scattered areas of increased vascularity. Prominent echogenic material within this region likely reflects hemorrhage/clot. No definite discrete or defined products of conception. Associated large volume complex free fluid present within the pelvis. Findings highly concerning for a possible ruptured left ectopic pregnancy position within the left adnexa. IMPRESSION: 1. No IUP. 2. 8.9 x 4.6  x 7.5 cm complex heterogeneous mass-like area within the left adnexa with large volume complex free fluid within the pelvis. Findings are highly concerning for possible ruptured ectopic pregnancy. 3. 1.4 cm complex right ovarian cyst, likely a small corpus luteal cyst and/or hemorrhagic cyst. Critical Value/emergent results were called by telephone at the time of interpretation on 09/28/2019 at 10:55 pm to provider [place] ROBINSON , who verbally acknowledged these results. A 6 Electronically Signed   By: Jeannine Boga M.D.   On: 09/28/2019 23:05    Assessment/Plan: Possible ruptured ectopic pregnancy.  DX reviewed with pt. Surgerical management discussed. R/B/Post op care reviewed with pt. OR notified and will proceed to OR when room available. Pt is presently hemodynamically stable. Pt verbalized understanding  Chancy Milroy 09/29/2019, 12:09 AM

## 2019-09-29 NOTE — Anesthesia Preprocedure Evaluation (Signed)
Anesthesia Evaluation  Patient identified by MRN, date of birth, ID band Patient awake    Reviewed: Allergy & Precautions, H&P , NPO status , Patient's Chart, lab work & pertinent test results  Airway Mallampati: II   Neck ROM: full    Dental   Pulmonary asthma ,    breath sounds clear to auscultation       Cardiovascular negative cardio ROS   Rhythm:regular Rate:Normal     Neuro/Psych    GI/Hepatic   Endo/Other    Renal/GU      Musculoskeletal   Abdominal   Peds  Hematology   Anesthesia Other Findings   Reproductive/Obstetrics Ectopic pregnancy                             Anesthesia Physical Anesthesia Plan  ASA: II and emergent  Anesthesia Plan: General   Post-op Pain Management:    Induction: Intravenous  PONV Risk Score and Plan: 3 and Ondansetron, Dexamethasone, Midazolam and Treatment may vary due to age or medical condition  Airway Management Planned: Oral ETT  Additional Equipment:   Intra-op Plan:   Post-operative Plan: Extubation in OR  Informed Consent: I have reviewed the patients History and Physical, chart, labs and discussed the procedure including the risks, benefits and alternatives for the proposed anesthesia with the patient or authorized representative who has indicated his/her understanding and acceptance.       Plan Discussed with: CRNA, Anesthesiologist and Surgeon  Anesthesia Plan Comments:         Anesthesia Quick Evaluation

## 2019-09-29 NOTE — Anesthesia Procedure Notes (Signed)
Procedure Name: Intubation Date/Time: 09/29/2019 1:44 AM Performed by: Claris Che, CRNA Pre-anesthesia Checklist: Patient identified, Emergency Drugs available, Suction available, Patient being monitored and Timeout performed Patient Re-evaluated:Patient Re-evaluated prior to induction Oxygen Delivery Method: Circle system utilized Preoxygenation: Pre-oxygenation with 100% oxygen Induction Type: IV induction, Rapid sequence and Cricoid Pressure applied Laryngoscope Size: Mac and 4 Grade View: Grade I Tube size: 7.5 mm Number of attempts: 1 Airway Equipment and Method: Stylet Placement Confirmation: ETT inserted through vocal cords under direct vision,  positive ETCO2 and breath sounds checked- equal and bilateral Secured at: 23 cm Tube secured with: Tape Dental Injury: Teeth and Oropharynx as per pre-operative assessment

## 2019-09-30 ENCOUNTER — Other Ambulatory Visit: Payer: Self-pay | Admitting: Obstetrics and Gynecology

## 2019-09-30 ENCOUNTER — Encounter (HOSPITAL_COMMUNITY): Payer: Self-pay | Admitting: Obstetrics and Gynecology

## 2019-09-30 LAB — GC/CHLAMYDIA PROBE AMP (~~LOC~~) NOT AT ARMC
Chlamydia: NEGATIVE
Comment: NEGATIVE
Comment: NORMAL
Neisseria Gonorrhea: NEGATIVE

## 2019-09-30 LAB — SURGICAL PATHOLOGY

## 2019-10-01 NOTE — Anesthesia Postprocedure Evaluation (Signed)
Anesthesia Post Note  Patient: Savannah Ryan  Procedure(s) Performed: LAPAROSCOPY DIAGNOSTIC,  Left Salpingectomy with Removal Ectopic Pregnancy (N/A Abdomen)     Patient location during evaluation: PACU Anesthesia Type: General Level of consciousness: awake and alert Pain management: pain level controlled Vital Signs Assessment: post-procedure vital signs reviewed and stable Respiratory status: spontaneous breathing, nonlabored ventilation, respiratory function stable and patient connected to nasal cannula oxygen Cardiovascular status: blood pressure returned to baseline and stable Postop Assessment: no apparent nausea or vomiting Anesthetic complications: no   No complications documented.  Last Vitals:  Vitals:   09/29/19 0355 09/29/19 0400  BP:  136/74  Pulse: (!) 57 (!) 59  Resp: 12 15  Temp:  (!) 36.3 C  SpO2: 98% 100%    Last Pain:  Vitals:   09/29/19 0400  TempSrc:   PainSc: 0-No pain                 [name]

## 2019-10-17 ENCOUNTER — Other Ambulatory Visit (INDEPENDENT_AMBULATORY_CARE_PROVIDER_SITE_OTHER): Payer: Self-pay

## 2019-10-29 ENCOUNTER — Ambulatory Visit (INDEPENDENT_AMBULATORY_CARE_PROVIDER_SITE_OTHER): Payer: 59 | Admitting: Obstetrics and Gynecology

## 2019-10-29 ENCOUNTER — Encounter: Payer: Self-pay | Admitting: Obstetrics and Gynecology

## 2019-10-29 ENCOUNTER — Other Ambulatory Visit: Payer: Self-pay

## 2019-10-29 VITALS — BP 124/81 | HR 89 | Ht 65.0 in | Wt 138.1 lb

## 2019-10-29 DIAGNOSIS — Z3202 Encounter for pregnancy test, result negative: Secondary | ICD-10-CM | POA: Diagnosis not present

## 2019-10-29 DIAGNOSIS — Z9079 Acquired absence of other genital organ(s): Secondary | ICD-10-CM

## 2019-10-29 DIAGNOSIS — Z9889 Other specified postprocedural states: Secondary | ICD-10-CM

## 2019-10-29 DIAGNOSIS — Z3042 Encounter for surveillance of injectable contraceptive: Secondary | ICD-10-CM

## 2019-10-29 DIAGNOSIS — Z3009 Encounter for other general counseling and advice on contraception: Secondary | ICD-10-CM

## 2019-10-29 MED ORDER — MEDROXYPROGESTERONE ACETATE 150 MG/ML IM SUSP
150.0000 mg | Freq: Once | INTRAMUSCULAR | Status: AC
  Administered 2019-10-29: 150 mg via INTRAMUSCULAR

## 2019-10-29 NOTE — Patient Instructions (Signed)
Use the following websites (and others) to help learn more about your contraception options and find the method that is right for you!  - The Centers for Disease Control (CDC) website: https://www.cdc.gov/reproductivehealth/contraception/index.htm  - [facility] website: https://www.plannedparenthood.org/learn/birth-control  - Bedsider.org: https://www.bedsider.org/methods   Go to bedsider.org for more information!  Contraception Choices Contraception, also called birth control, refers to methods or devices that prevent pregnancy. Hormonal methods Contraceptive implant A contraceptive implant is a thin, plastic tube that contains a hormone. It is inserted into the upper part of the arm. It can remain in place for up to 3 years. Progestin-only injections Progestin-only injections are injections of progestin, a synthetic form of the hormone progesterone. They are given every 3 months by a health care provider. Birth control pills Birth control pills are pills that contain hormones that prevent pregnancy. They must be taken once a day, preferably at the same time each day. Birth control patch The birth control patch contains hormones that prevent pregnancy. It is placed on the skin and must be changed once a week for three weeks and removed on the fourth week. A prescription is needed to use this method of contraception. Vaginal ring A vaginal ring contains hormones that prevent pregnancy. It is placed in the vagina for three weeks and removed on the fourth week. After that, the process is repeated with a new ring. A prescription is needed to use this method of contraception. Emergency contraceptive Emergency contraceptives prevent pregnancy after unprotected sex. They come in pill form and can be taken up to 5 days after sex. They work best the sooner they are taken after having sex. Most emergency contraceptives are available without a prescription. This method should not be used as  your only form of birth control. Barrier methods Female condom A female condom is a thin sheath that is worn over the penis during sex. Condoms keep sperm from going inside a woman's body. They can be used with a spermicide to increase their effectiveness. They should be disposed after a single use. Female condom A female condom is a soft, loose-fitting sheath that is put into the vagina before sex. The condom keeps sperm from going inside a woman's body. They should be disposed after a single use.  Intrauterine contraception Intrauterine device (IUD) An IUD is a T-shaped device that is put in a woman's uterus. There are two types:  Hormone IUD.This type contains progestin, a synthetic form of the hormone progesterone. This type can stay in place for 3-5 years.  Copper IUD.This type is wrapped in copper wire. It can stay in place for 10 years.  Permanent methods of contraception Female tubal ligation In this method, a woman's fallopian tubes are sealed, tied, or blocked during surgery to prevent eggs from traveling to the uterus.  Female sterilization This is a procedure to tie off the tubes that carry sperm (vasectomy). After the procedure, the man can still ejaculate fluid (semen).  Summary  Contraception, also called birth control, means methods or devices that prevent pregnancy.  Hormonal methods of contraception include implants, injections, pills, patches, vaginal rings, and emergency contraceptives.  Barrier methods of contraception can include female condoms, female condoms, diaphragms, cervical caps, sponges, and spermicides.  There are two types of IUDs (intrauterine devices). An IUD can be put in a woman's uterus to prevent pregnancy for 3-5 years.  Permanent sterilization can be done through a procedure for males, females, or both. This information is not intended to replace advice given to you   by your health care provider. Make sure you discuss any questions you have with your  health care provider. Document Released: 03/27/2005 Document Revised: 04/29/2016 Document Reviewed: 04/29/2016 Elsevier Interactive Patient Education  2018 Elsevier Inc.  

## 2019-10-29 NOTE — Progress Notes (Signed)
   GYNECOLOGY OFFICE FOLLOW UP NOTE  History:  39 y.o. [id number] here today for follow up for laparoscopic left slapingectomy and removal of ectopic pregnancy for ruptured ectopic pregnancy. She is doing well. Pain is 2/10. Eating and drinking well. No issues with voiding. Feeling well, has not returned to work yet.  Past Medical History:  Diagnosis Date  . Asthma    Pt. does not take asthma meds, does not have a rescue inhalaer     Past Surgical History:  Procedure Laterality Date  . LAPAROSCOPY N/A 09/29/2019   Procedure: LAPAROSCOPY DIAGNOSTIC,  Left Salpingectomy with Removal Ectopic Pregnancy;  Surgeon: Hermina Staggers, MD;  Location: [facility] OR;  Service: Gynecology;  Laterality: N/A;     Current Outpatient Medications:  .  acetaminophen (TYLENOL) 500 MG tablet, Take 500 mg by mouth every 6 (six) hours as needed., Disp: , Rfl:  .  ibuprofen (ADVIL) 800 MG tablet, Take 1 tablet (800 mg total) by mouth every 8 (eight) hours as needed., Disp: 30 tablet, Rfl: 0  The following portions of the patient's history were reviewed and updated as appropriate: allergies, current medications, past family history, past medical history, past social history, past surgical history and problem list.   Review of Systems:  Pertinent items noted in HPI and remainder of comprehensive ROS otherwise negative.   Objective:  Physical Exam BP 124/81   Pulse 89   Ht 5\' 5"  (1.651 m)   Wt 138 lb 1.6 oz (62.6 kg)   LMP 10/27/2019   BMI 22.98 kg/m  CONSTITUTIONAL: Well-developed, well-nourished female in no acute distress.  HENT:  Normocephalic, atraumatic. External right and left ear normal. Oropharynx is clear and moist EYES: Conjunctivae and EOM are normal. Pupils are equal, round, and reactive to light. No scleral icterus.  NECK: Normal range of motion, supple, no masses SKIN: Skin is warm and dry. No rash noted. Not diaphoretic. No erythema. No pallor. NEUROLOGIC: Alert and oriented to person, place, and  time. Normal reflexes, muscle tone coordination. No cranial nerve deficit noted. PSYCHIATRIC: Normal mood and affect. Normal behavior. Normal judgment and thought content. CARDIOVASCULAR: Normal heart rate noted RESPIRATORY: Effort normal, no problems with respiration noted ABDOMEN: Soft, no distention noted. Incisions healing well, suture removed at umbilical incision PELVIC: deferred MUSCULOSKELETAL: Normal range of motion. No edema noted.  Labs and Imaging No results found.  Assessment & Plan:   1. Post-operative state Doing well Letter given to return to work today  2. Encounter for counseling regarding contraception Reviewed options for birth control including oral contraceptive pills (combination and progesterone only), NuvaRing, Depo-Provera, Nexplanon, IUDs (copper and levonorgestrol). Thoroughly reviewed risks/benefits/side effects of each. Answered all questions. Patient with h/o of n/a and opts for depo, will likely return for IUD - POCT urine pregnancy - medroxyPROGESTERone (DEPO-PROVERA) injection 150 mg   Routine preventative health maintenance measures emphasized. Please refer to After Visit Summary for other counseling recommendations.   Return in about 4 weeks (around 11/26/2019).  Total face-to-face time with patient: 15 minutes. Over 50% of encounter was spent on counseling and coordination of care.  11/28/2019, M.D. Attending Center for [facility] [employer])

## 2019-10-30 LAB — POCT PREGNANCY, URINE: Preg Test, Ur: NEGATIVE

## 2019-11-27 ENCOUNTER — Other Ambulatory Visit: Payer: Self-pay

## 2019-11-27 ENCOUNTER — Ambulatory Visit (INDEPENDENT_AMBULATORY_CARE_PROVIDER_SITE_OTHER): Payer: 59 | Admitting: Obstetrics and Gynecology

## 2019-11-27 ENCOUNTER — Other Ambulatory Visit (HOSPITAL_COMMUNITY)
Admission: RE | Admit: 2019-11-27 | Discharge: 2019-11-27 | Disposition: A | Discharge status: Home / Self Care | Payer: 59 | Source: Ambulatory Visit | Attending: Obstetrics and Gynecology | Admitting: Obstetrics and Gynecology

## 2019-11-27 ENCOUNTER — Encounter: Payer: Self-pay | Admitting: Obstetrics and Gynecology

## 2019-11-27 VITALS — BP 147/88 | HR 65 | Wt 131.0 lb

## 2019-11-27 DIAGNOSIS — Z124 Encounter for screening for malignant neoplasm of cervix: Secondary | ICD-10-CM

## 2019-11-27 DIAGNOSIS — R030 Elevated blood-pressure reading, without diagnosis of hypertension: Secondary | ICD-10-CM | POA: Diagnosis not present

## 2019-11-27 DIAGNOSIS — Z113 Encounter for screening for infections with a predominantly sexual mode of transmission: Secondary | ICD-10-CM

## 2019-11-27 DIAGNOSIS — Z01419 Encounter for gynecological examination (general) (routine) without abnormal findings: Secondary | ICD-10-CM | POA: Diagnosis not present

## 2019-11-27 DIAGNOSIS — Z3009 Encounter for other general counseling and advice on contraception: Secondary | ICD-10-CM

## 2019-11-27 DIAGNOSIS — Z7189 Other specified counseling: Secondary | ICD-10-CM | POA: Diagnosis not present

## 2019-11-27 DIAGNOSIS — Z1231 Encounter for screening mammogram for malignant neoplasm of breast: Secondary | ICD-10-CM | POA: Diagnosis not present

## 2019-11-27 NOTE — Progress Notes (Signed)
GYNECOLOGY ANNUAL PREVENTATIVE CARE ENCOUNTER NOTE  Subjective:   Savannah Ryan is a 39 y.o. [phone] female here for a annual gynecologic exam. Current complaints: none. Happy with depo.    Denies abnormal vaginal bleeding, discharge, pelvic pain, problems with intercourse or other gynecologic concerns. Accepts STI screen.   Gynecologic History Patient's last menstrual period was 10/29/2019. Contraception: Depo-Provera injections Last Pap: > 1 yr. Results: normal Last mammogram: n/a DEXA: has never had  Obstetric History OB History  Gravida Para Term Preterm AB Living  5 3 3   2 3   SAB TAB Ectopic Multiple Live Births  1       3    # Outcome Date GA Lbr Len/2nd Weight Sex Delivery Anes PTL Lv  5 Term 09/27/97    F Vag-Spont  N LIV  4 AB           3 SAB           2 Term           1 Term             Past Medical History:  Diagnosis Date   Asthma    Pt. does not take asthma meds, does not have a rescue inhalaer     Past Surgical History:  Procedure Laterality Date   LAPAROSCOPY N/A 09/29/2019   Procedure: LAPAROSCOPY DIAGNOSTIC,  Left Salpingectomy with Removal Ectopic Pregnancy;  Surgeon: 10/01/2019, MD;  Location: [facility] OR;  Service: Gynecology;  Laterality: N/A;    Current Outpatient Medications on File Prior to Visit  Medication Sig Dispense Refill   acetaminophen (TYLENOL) 500 MG tablet Take 500 mg by mouth every 6 (six) hours as needed. (Patient not taking: Reported on 11/27/2019)     ibuprofen (ADVIL) 800 MG tablet Take 1 tablet (800 mg total) by mouth every 8 (eight) hours as needed. (Patient not taking: Reported on 11/27/2019) 30 tablet 0   No current facility-administered medications on file prior to visit.    No Known Allergies  Social History   Socioeconomic History   Marital status: Single    Spouse name: Not on file   Number of children: Not on file   Years of education: Not on file   Highest education level: Not on file  Occupational  History   Not on file  Tobacco Use   Smoking status: Never Smoker   Smokeless tobacco: Never Used  Substance and Sexual Activity   Alcohol use: Yes   Drug use: No   Sexual activity: Yes    Birth control/protection: None    Comment: took Plan B post last sexual encounter in April 2021  Other Topics Concern   Not on file  Social History Narrative   Not on file   Social Determinants of Health   Financial Resource Strain:    Difficulty of Paying Living Expenses: Not on file  Food Insecurity:    Worried About May 2021 in the Last Year: Not on file   Programme researcher, broadcasting/film/video of Food in the Last Year: Not on file  Transportation Needs:    Lack of Transportation (Medical): Not on file   Lack of Transportation (Non-Medical): Not on file  Physical Activity:    Days of Exercise per Week: Not on file   Minutes of Exercise per Session: Not on file  Stress:    Feeling of Stress : Not on file  Social Connections:    Frequency of Communication with Friends and  Family: Not on file   Frequency of Social Gatherings with Friends and Family: Not on file   Attends Religious Services: Not on file   Active Member of Clubs or Organizations: Not on file   Attends Banker Meetings: Not on file   Marital Status: Not on file  Intimate Partner Violence:    Fear of Current or Ex-Partner: Not on file   Emotionally Abused: Not on file   Physically Abused: Not on file   Sexually Abused: Not on file    Family History  Problem Relation Age of Onset   Diabetes Maternal Grandmother     The following portions of the patient's history were reviewed and updated as appropriate: allergies, current medications, past family history, past medical history, past social history, past surgical history and problem list.  Review of Systems Pertinent items are noted in HPI.   Objective:  BP (!) 147/88    Pulse 65    Wt 131 lb (59.4 kg)    LMP 10/29/2019    BMI 21.80 kg/m    CONSTITUTIONAL: Well-developed, well-nourished female in no acute distress.  HENT:  Normocephalic, atraumatic, External right and left ear normal. Oropharynx is clear and moist EYES: Conjunctivae and EOM are normal. Pupils are equal, round, and reactive to light. No scleral icterus.  NECK: Normal range of motion, supple, no masses.  Normal thyroid.  SKIN: Skin is warm and dry. No rash noted. Not diaphoretic. No erythema. No pallor. NEUROLOGIC: Alert and oriented to person, place, and time. Normal reflexes, muscle tone coordination. No cranial nerve deficit noted. PSYCHIATRIC: Normal mood and affect. Normal behavior. Normal judgment and thought content. CARDIOVASCULAR: Normal heart rate noted RESPIRATORY: Effort normal, no problems with respiration noted. BREASTS: Symmetric in size. No masses, skin changes, nipple drainage, or lymphadenopathy. ABDOMEN: Soft, no distention noted.  No tenderness, rebound or guarding.  PELVIC: Normal appearing external genitalia; normal appearing vaginal mucosa and cervix.  No abnormal discharge noted.  Pap smear obtained. Pelvic cultures obtained. Normal uterine size, no other palpable masses, no uterine or adnexal tenderness. MUSCULOSKELETAL: Normal range of motion. No tenderness.  No cyanosis, clubbing, or edema.  2+ distal pulses.  Exam done with chaperone present.  Assessment and Plan:   1. Encounter for annual routine gynecological examination - Cytology - PAP( [place])  2. Elevated BP without diagnosis of hypertension - no h/o HTN - Ambulatory referral to [facility]  3. Encounter for screening mammogram for malignant neoplasm of breast - MM 3D SCREEN BREAST BILATERAL; Future  4. Counseled about COVID-19 virus infection The patient was counseled on the potential benefits and lack of known risks of COVID vaccination, during pregnancy and breastfeeding, on today's visit. The patient's questions and concerns were addressed today, including  unsure about safety. The patient is still unsure of her decision for vaccination.   5. Well woman exam Healthy exam  6. Routine screening for STI (sexually transmitted infection) - Hepatitis B surface antigen - Hepatitis C antibody - HIV Antibody (routine testing w rflx) - RPR - Cervicovaginal ancillary only  7. Cervical cancer screening Pap today  8. Encounter for counseling regarding contraception Happy with depo Cont depo  Will follow up results of pap smear/STI screen and manage accordingly. Encouraged improvement in diet and exercise.  COVID vaccine counseling today Accepts STI screen. Mammogram ordered Referral for colonoscopy n/a Flu vaccine n/a DEXA not due based on age  Routine preventative health maintenance measures emphasized. Please refer to After Visit Summary for  other counseling recommendations.   Total face-to-face time with patient: 30 minutes. Over 50% of encounter was spent on counseling and coordination of care.   Baldemar Lenis, M.D. [facility]ending [facility])

## 2019-11-27 NOTE — Progress Notes (Signed)
Will stick with depo provera shots.

## 2019-11-28 LAB — CERVICOVAGINAL ANCILLARY ONLY
Chlamydia: NEGATIVE
Comment: NEGATIVE
Comment: NEGATIVE
Comment: NORMAL
Neisseria Gonorrhea: NEGATIVE
Trichomonas: NEGATIVE

## 2019-11-28 LAB — CYTOLOGY - PAP
Chlamydia: NEGATIVE
Comment: NEGATIVE
Comment: NEGATIVE
Comment: NORMAL
Diagnosis: NEGATIVE
High risk HPV: NEGATIVE
Neisseria Gonorrhea: NEGATIVE

## 2019-11-28 LAB — HEPATITIS C ANTIBODY: Hep C Virus Ab: <0.1 s/co ratio (ref 0.0–0.9)

## 2019-11-28 LAB — HEPATITIS B SURFACE ANTIGEN: Hepatitis B Surface Ag: NEGATIVE

## 2019-11-28 LAB — RPR: RPR Ser Ql: NONREACTIVE

## 2019-11-28 LAB — HIV ANTIBODY (ROUTINE TESTING W REFLEX): HIV Screen 4th Generation wRfx: NONREACTIVE

## 2019-12-11 ENCOUNTER — Ambulatory Visit: Payer: 59

## 2019-12-12 ENCOUNTER — Other Ambulatory Visit: Payer: Self-pay

## 2019-12-12 ENCOUNTER — Ambulatory Visit
Admission: RE | Admit: 2019-12-12 | Discharge: 2019-12-12 | Disposition: A | Discharge status: Home / Self Care | Payer: 59 | Source: Ambulatory Visit | Attending: Obstetrics and Gynecology | Admitting: Obstetrics and Gynecology

## 2019-12-12 DIAGNOSIS — Z1231 Encounter for screening mammogram for malignant neoplasm of breast: Secondary | ICD-10-CM

## 2020-01-27 ENCOUNTER — Ambulatory Visit (INDEPENDENT_AMBULATORY_CARE_PROVIDER_SITE_OTHER): Payer: 59

## 2020-01-27 ENCOUNTER — Other Ambulatory Visit: Payer: Self-pay

## 2020-01-27 VITALS — Wt 136.0 lb

## 2020-01-27 DIAGNOSIS — Z3042 Encounter for surveillance of injectable contraceptive: Secondary | ICD-10-CM | POA: Diagnosis not present

## 2020-01-27 MED ORDER — MEDROXYPROGESTERONE ACETATE 150 MG/ML IM SUSP
150.0000 mg | Freq: Once | INTRAMUSCULAR | Status: AC
  Administered 2020-01-27: 150 mg via INTRAMUSCULAR

## 2020-01-27 NOTE — Progress Notes (Signed)
Chart reviewed for nurse visit. Agree with plan of care.   Judeth Horn, NP 01/27/2020 12:52 PM

## 2020-01-27 NOTE — Progress Notes (Signed)
Savannah Ryan here for Depo-Provera Injection. Injection administered without complication. Patient will return in 3 months for next injection between 04/13/20 and 04/27/20. Next annual visit due 11/26/20.   Marjo Bicker, RN 01/27/2020  9:24 AM

## 2020-04-13 ENCOUNTER — Ambulatory Visit (INDEPENDENT_AMBULATORY_CARE_PROVIDER_SITE_OTHER): Payer: 59 | Admitting: Lactation Services

## 2020-04-13 ENCOUNTER — Encounter: Payer: Self-pay | Admitting: Lactation Services

## 2020-04-13 DIAGNOSIS — Z3042 Encounter for surveillance of injectable contraceptive: Secondary | ICD-10-CM | POA: Diagnosis not present

## 2020-04-13 MED ORDER — MEDROXYPROGESTERONE ACETATE 150 MG/ML IM SUSP
150.0000 mg | Freq: Once | INTRAMUSCULAR | Status: AC
  Administered 2020-04-13: 150 mg via INTRAMUSCULAR

## 2020-04-13 NOTE — Progress Notes (Signed)
Savannah Ryan here for Depo-Provera Injection. Injection administered without complication. Patient will return in 3 months for next injection between 06/30/2019 and 07/13/2020. Next annual visit due 12/06/2020. Charting delayed as computer was down at time of patient visit.   Ed Blalock, RN 04/13/2020  10:53 AM

## 2020-04-13 NOTE — Progress Notes (Signed)
ATTESTATION OF SUPERVISION OF RN: Evaluation and management procedures were performed by the RN under my supervision and collaboration. I have reviewed the nursing note and chart and agree with the management and plan for this patient.  Ajla Mcgeachy, CNM  

## 2020-06-29 ENCOUNTER — Ambulatory Visit (INDEPENDENT_AMBULATORY_CARE_PROVIDER_SITE_OTHER): Payer: 59 | Admitting: Lactation Services

## 2020-06-29 ENCOUNTER — Encounter: Payer: Self-pay | Admitting: Lactation Services

## 2020-06-29 ENCOUNTER — Other Ambulatory Visit: Payer: Self-pay

## 2020-06-29 VITALS — Ht 64.0 in | Wt 139.1 lb

## 2020-06-29 DIAGNOSIS — Z3042 Encounter for surveillance of injectable contraceptive: Secondary | ICD-10-CM | POA: Diagnosis not present

## 2020-06-29 MED ORDER — MEDROXYPROGESTERONE ACETATE 150 MG/ML IM SUSP
150.0000 mg | Freq: Once | INTRAMUSCULAR | Status: AC
  Administered 2020-06-29: 150 mg via INTRAMUSCULAR

## 2020-06-29 NOTE — Progress Notes (Signed)
Savannah Ryan here for Depo-Provera Injection. Injection administered without complication. Patient will return in 3 months for next injection between June 7 and June 21. Next annual visit due 12/06/2020. Patient with no questions or concerns at this time.   Ed Blalock, RN 06/29/2020  9:56 AM

## 2020-06-30 NOTE — Progress Notes (Signed)
I have reviewed and agree with the findings and care plan documented in the RN note.   Jakhai Fant, MD OB Family Medicine Fellow, [facility], [facility]  

## 2020-09-14 ENCOUNTER — Other Ambulatory Visit: Payer: Self-pay

## 2020-09-14 ENCOUNTER — Ambulatory Visit (INDEPENDENT_AMBULATORY_CARE_PROVIDER_SITE_OTHER): Payer: 59

## 2020-09-14 VITALS — BP 131/84 | HR 72 | Wt 140.9 lb

## 2020-09-14 DIAGNOSIS — Z3042 Encounter for surveillance of injectable contraceptive: Secondary | ICD-10-CM | POA: Diagnosis not present

## 2020-09-14 MED ORDER — MEDROXYPROGESTERONE ACETATE 150 MG/ML IM SUSP
150.0000 mg | Freq: Once | INTRAMUSCULAR | Status: AC
Start: 2020-09-14 — End: 2020-09-14
  Administered 2020-09-14: 150 mg via INTRAMUSCULAR

## 2020-09-14 NOTE — Progress Notes (Signed)
Audie Pinto here for Depo-Provera Injection. Injection administered without complication. Patient will return in 3 months for next injection between 11/30/20 and 12/14/20. Next annual visit due August 2022.   Marjo Bicker, RN 09/14/2020  9:09 AM

## 2020-09-15 NOTE — Progress Notes (Signed)
I agree with the findings and care plan in the RN note.    Casper Harrison, MD [facility] Family Medicine Fellow, [facility], [facility]

## 2020-11-30 ENCOUNTER — Ambulatory Visit: Payer: 59

## 2021-02-22 ENCOUNTER — Other Ambulatory Visit: Payer: Self-pay | Admitting: Obstetrics and Gynecology

## 2021-02-22 DIAGNOSIS — Z1231 Encounter for screening mammogram for malignant neoplasm of breast: Secondary | ICD-10-CM

## 2021-02-23 ENCOUNTER — Other Ambulatory Visit: Payer: Self-pay

## 2021-02-23 ENCOUNTER — Ambulatory Visit
Admission: RE | Admit: 2021-02-23 | Discharge: 2021-02-23 | Disposition: A | Discharge status: Home / Self Care | Payer: 59 | Source: Ambulatory Visit | Attending: Obstetrics and Gynecology | Admitting: Obstetrics and Gynecology

## 2021-02-23 DIAGNOSIS — Z1231 Encounter for screening mammogram for malignant neoplasm of breast: Secondary | ICD-10-CM

## 2021-05-18 ENCOUNTER — Other Ambulatory Visit: Payer: Self-pay

## 2021-05-18 ENCOUNTER — Ambulatory Visit (INDEPENDENT_AMBULATORY_CARE_PROVIDER_SITE_OTHER): Payer: 59 | Admitting: Family Medicine

## 2021-05-18 VITALS — BP 124/76 | HR 92 | Wt 143.0 lb

## 2021-05-18 DIAGNOSIS — Z30016 Encounter for initial prescription of transdermal patch hormonal contraceptive device: Secondary | ICD-10-CM

## 2021-05-18 DIAGNOSIS — Z13228 Encounter for screening for other metabolic disorders: Secondary | ICD-10-CM | POA: Diagnosis not present

## 2021-05-18 DIAGNOSIS — Z01419 Encounter for gynecological examination (general) (routine) without abnormal findings: Secondary | ICD-10-CM | POA: Diagnosis not present

## 2021-05-18 DIAGNOSIS — Z3202 Encounter for pregnancy test, result negative: Secondary | ICD-10-CM | POA: Diagnosis not present

## 2021-05-18 LAB — POCT PREGNANCY, URINE: Preg Test, Ur: NEGATIVE

## 2021-05-18 MED ORDER — NORELGESTROMIN-ETH ESTRADIOL 150-35 MCG/24HR TD PTWK: 1.0000 | MEDICATED_PATCH | TRANSDERMAL | 12 refills | Status: AC

## 2021-05-18 NOTE — Assessment & Plan Note (Addendum)
Cancer screening: - Pap: up to date - Mammogram: up to date - Colonoscopy: not yet due   Contraception: Desires to start patch today, rx sent  Mood: Denies any depression  Vaccinations: - Flu: declines - HPV: n/a - COVID: declines - PPSV23: n/a - Other: n/a  Metabolic: - DM:  123456 ordered - Lipids: fasting panel ordered  ID: - STI: declines

## 2021-05-18 NOTE — Patient Instructions (Signed)
Contraception Choices °Contraception, also called birth control, refers to methods or devices that prevent pregnancy. °Hormonal methods °Contraceptive implant °A contraceptive implant is a thin, plastic tube that contains a hormone that prevents pregnancy. It is different from an intrauterine device (IUD). It is inserted into the upper part of the arm by a health care provider. Implants can be effective for up to 3 years. °Progestin-only injections °Progestin-only injections are injections of progestin, a synthetic form of the hormone progesterone. They are given every 3 months by a health care provider. °Birth control pills °Birth control pills are pills that contain hormones that prevent pregnancy. They must be taken once a day, preferably at the same time each day. A prescription is needed to use this method of contraception. °Birth control patch °The birth control patch contains hormones that prevent pregnancy. It is placed on the skin and must be changed once a week for three weeks and removed on the fourth week. A prescription is needed to use this method of contraception. °Vaginal ring °A vaginal ring contains hormones that prevent pregnancy. It is placed in the vagina for three weeks and removed on the fourth week. After that, the process is repeated with a new ring. A prescription is needed to use this method of contraception. °Emergency contraceptive °Emergency contraceptives prevent pregnancy after unprotected sex. They come in pill form and can be taken up to 5 days after sex. They work best the sooner they are taken after having sex. Most emergency contraceptives are available without a prescription. This method should not be used as your only form of birth control. °Barrier methods °Female condom °A female condom is a thin sheath that is worn over the penis during sex. Condoms keep sperm from going inside a woman's body. They can be used with a sperm-killing substance (spermicide) to increase their  effectiveness. They should be thrown away after one use. °Female condom °A female condom is a soft, loose-fitting sheath that is put into the vagina before sex. The condom keeps sperm from going inside a woman's body. They should be thrown away after one use. °Diaphragm °A diaphragm is a soft, dome-shaped barrier. It is inserted into the vagina before sex, along with a spermicide. The diaphragm blocks sperm from entering the uterus, and the spermicide kills sperm. A diaphragm should be left in the vagina for 6-8 hours after sex and removed within 24 hours. °A diaphragm is prescribed and fitted by a health care provider. A diaphragm should be replaced every 1-2 years, after giving birth, after gaining more than 15 lb (6.8 kg), and after pelvic surgery. °Cervical cap °A cervical cap is a round, soft latex or plastic cup that fits over the cervix. It is inserted into the vagina before sex, along with spermicide. It blocks sperm from entering the uterus. The cap should be left in place for 6-8 hours after sex and removed within 48 hours. A cervical cap must be prescribed and fitted by a health care provider. It should be replaced every 2 years. °Sponge °A sponge is a soft, circular piece of polyurethane foam with spermicide in it. The sponge helps block sperm from entering the uterus, and the spermicide kills sperm. To use it, you make it wet and then insert it into the vagina. It should be inserted before sex, left in for at least 6 hours after sex, and removed and thrown away within 30 hours. °Spermicides °Spermicides are chemicals that kill or block sperm from entering the cervix and uterus.   They can come as a cream, jelly, suppository, foam, or tablet. A spermicide should be inserted into the vagina with an applicator at least 88-41 minutes before sex to allow time for it to work. The process must be repeated every time you have sex. Spermicides do not require a prescription. Intrauterine  contraception Intrauterine device (IUD) An IUD is a T-shaped device that is put in a woman's uterus. There are two types: Hormone IUD.This type contains progestin, a synthetic form of the hormone progesterone. This type can stay in place for 3-5 years. Copper IUD.This type is wrapped in copper wire. It can stay in place for 10 years. Permanent methods of contraception Female tubal ligation In this method, a woman's fallopian tubes are sealed, tied, or blocked during surgery to prevent eggs from traveling to the uterus. Hysteroscopic sterilization In this method, a small, flexible insert is placed into each fallopian tube. The inserts cause scar tissue to form in the fallopian tubes and block them, so sperm cannot reach an egg. The procedure takes about 3 months to be effective. Another form of birth control must be used during those 3 months. Female sterilization This is a procedure to tie off the tubes that carry sperm (vasectomy). After the procedure, the man can still ejaculate fluid (semen). Another form of birth control must be used for 3 months after the procedure. Natural planning methods Natural family planning In this method, a couple does not have sex on days when the woman could become pregnant. Calendar method In this method, the woman keeps track of the length of each menstrual cycle, identifies the days when pregnancy can happen, and does not have sex on those days. Ovulation method In this method, a couple avoids sex during ovulation. Symptothermal method This method involves not having sex during ovulation. The woman typically checks for ovulation by watching changes in her temperature and in the consistency of cervical mucus. Post-ovulation method In this method, a couple waits to have sex until after ovulation. Where to find more information Centers for Disease Control and Prevention: http://www.wolf.info/ Summary Contraception, also called birth control, refers to methods or devices  that prevent pregnancy. Hormonal methods of contraception include implants, injections, pills, patches, vaginal rings, and emergency contraceptives. Barrier methods of contraception can include female condoms, female condoms, diaphragms, cervical caps, sponges, and spermicides. There are two types of IUDs (intrauterine devices). An IUD can be put in a woman's uterus to prevent pregnancy for 3-5 years. Permanent sterilization can be done through a procedure for males and females. Natural family planning methods involve nothaving sex on days when the woman could become pregnant. This information is not intended to replace advice given to you by your health care provider. Make sure you discuss any questions you have with your health care provider. Document Revised: 09/01/2019 Document Reviewed: 09/01/2019 Elsevier Patient Education  [place] 73-60 Years Old, Female Preventive care refers to lifestyle choices and visits with your health care provider that can promote health and wellness. Preventive care visits are also called wellness exams. What can I expect for my preventive care visit? Counseling During your preventive care visit, your health care provider may ask about your: Medical history, including: Past medical problems. Family medical history. Pregnancy history. Current health, including: Menstrual cycle. Method of birth control. Emotional well-being. Home life and relationship well-being. Sexual activity and sexual health. Lifestyle, including: Alcohol, nicotine or tobacco, and drug use. Access to firearms. Diet, exercise, and sleep habits. Work and  work environment. Sunscreen use. Safety issues such as seatbelt and bike helmet use. Physical exam Your health care provider may check your: Height and weight. These may be used to calculate your BMI (body mass index). BMI is a measurement that tells if you are at a healthy weight. Waist circumference.  This measures the distance around your waistline. This measurement also tells if you are at a healthy weight and may help predict your risk of certain diseases, such as type 2 diabetes and high blood pressure. Heart rate and blood pressure. Body temperature. Skin for abnormal spots. What immunizations do I need? Vaccines are usually given at various ages, according to a schedule. Your health care provider will recommend vaccines for you based on your age, medical history, and lifestyle or other factors, such as travel or where you work. What tests do I need? Screening Your health care provider may recommend screening tests for certain conditions. This may include: Pelvic exam and Pap test. Lipid and cholesterol levels. Diabetes screening. This is done by checking your blood sugar (glucose) after you have not eaten for a while (fasting). Hepatitis B test. Hepatitis C test. HIV (human immunodeficiency virus) test. STI (sexually transmitted infection) testing, if you are at risk. BRCA-related cancer screening. This may be done if you have a family history of breast, ovarian, tubal, or peritoneal cancers. Talk with your health care provider about your test results, treatment options, and if necessary, the need for more tests. Follow these instructions at home: Eating and drinking  Eat a healthy diet that includes fresh fruits and vegetables, whole grains, lean protein, and low-fat dairy products. Take vitamin and mineral supplements as recommended by your health care provider. Do not drink alcohol if: Your health care provider tells you not to drink. You are pregnant, may be pregnant, or are planning to become pregnant. If you drink alcohol: Limit how much you have to 0-1 drink a day. Know how much alcohol is in your drink. In the [place] one drink equals one 12 oz bottle of beer (355 mL), one 5 oz glass of wine (148 mL), or one 1 oz glass of hard liquor (44 mL). Lifestyle Brush your teeth  every morning and night with fluoride toothpaste. Floss one time each day. Exercise for at least 30 minutes 5 or more days each week. Do not use any products that contain nicotine or tobacco. These products include cigarettes, chewing tobacco, and vaping devices, such as e-cigarettes. If you need help quitting, ask your health care provider. Do not use drugs. If you are sexually active, practice safe sex. Use a condom or other form of protection to prevent STIs. If you do not wish to become pregnant, use a form of birth control. If you plan to become pregnant, see your health care provider for a prepregnancy visit. Find healthy ways to manage stress, such as: Meditation, yoga, or listening to music. Journaling. Talking to a trusted person. Spending time with friends and family. Minimize exposure to UV radiation to reduce your risk of skin cancer. Safety Always wear your seat belt while driving or riding in a vehicle. Do not drive: If you have been drinking alcohol. Do not ride with someone who has been drinking. If you have been using any mind-altering substances or drugs. While texting. When you are tired or distracted. Wear a helmet and other protective equipment during sports activities. If you have firearms in your house, make sure you follow all gun safety procedures. Seek help if you  have been physically or sexually abused. What's next? Go to your health care provider once a year for an annual wellness visit. Ask your health care provider how often you should have your eyes and teeth checked. Stay up to date on all vaccines. This information is not intended to replace advice given to you by your health care provider. Make sure you discuss any questions you have with your health care provider. Document Revised: 09/22/2020 Document Reviewed: 09/22/2020 Elsevier Patient Education  [place].

## 2021-05-18 NOTE — Progress Notes (Signed)
GYNECOLOGY OFFICE VISIT NOTE  History:   Savannah Ryan is a 41 y.o. [phone] here today for annual visit.  Interested in patch Does not smoke No hx of blood clots No problems with liver that she is aware of  Health Maintenance Due  Topic Date Due   COVID-19 Vaccine (1) Never done   TETANUS/TDAP  Never done   INFLUENZA VACCINE  Never done    Past Medical History:  Diagnosis Date   Asthma    Pt. does not take asthma meds, does not have a rescue inhalaer     Past Surgical History:  Procedure Laterality Date   LAPAROSCOPY N/A 09/29/2019   Procedure: LAPAROSCOPY DIAGNOSTIC,  Left Salpingectomy with Removal Ectopic Pregnancy;  Surgeon: Hermina Staggers, MD;  Location: [facility] OR;  Service: Gynecology;  Laterality: N/A;    The following portions of the patient's history were reviewed and updated as appropriate: allergies, current medications, past family history, past medical history, past social history, past surgical history and problem list.   Health Maintenance:   Last pap: Lab Results  Component Value Date   DIAGPAP  11/27/2019    - Negative for intraepithelial lesion or malignancy (NILM)   HPVHIGH Negative 11/27/2019     Last mammogram:  02/23/2021 - [facility] 1   Review of Systems:  Pertinent items noted in HPI and remainder of comprehensive ROS otherwise negative.  Physical Exam:  BP 124/76    Pulse 92    Wt 143 lb (64.9 kg)    LMP 05/06/2021 (Exact Date)    BMI 24.55 kg/m  CONSTITUTIONAL: Well-developed, well-nourished female in no acute distress.  HEENT:  Normocephalic, atraumatic. External right and left ear normal. No scleral icterus.  NECK: Normal range of motion, supple, no masses noted on observation SKIN: No rash noted. Not diaphoretic. No erythema. No pallor. MUSCULOSKELETAL: Normal range of motion. No edema noted. NEUROLOGIC: Alert and oriented to person, place, and time. Normal muscle tone coordination.  PSYCHIATRIC: Normal mood and affect. Normal  behavior. Normal judgment and thought content. RESPIRATORY: Effort normal, no problems with respiration noted   Labs and Imaging Results for orders placed or performed in visit on 05/18/21 (from the past 168 hour(s))  Pregnancy, urine POC   Collection Time: 05/18/21  2:47 PM  Result Value Ref Range   Preg Test, Ur NEGATIVE NEGATIVE   No results found.    Assessment and Plan:   Problem List Items Addressed This Visit       Other   Encounter for gynecological examination without abnormal finding - Primary    Cancer screening: - Pap: up to date - Mammogram: up to date - Colonoscopy: not yet due   Contraception: Desires to start patch today, rx sent  Mood: Denies any depression  Vaccinations: - Flu: declines - HPV: n/a - COVID: declines - PPSV23: n/a - Other: n/a  Metabolic: - DM:  A5W ordered - Lipids: fasting panel ordered  ID: - STI: declines      Other Visit Diagnoses     Encounter for initial prescription of transdermal patch hormonal contraceptive device       Relevant Medications   norelgestromin-ethinyl estradiol Burr Medico) 150-35 MCG/24HR transdermal patch   Other Relevant Orders   Pregnancy, urine POC (Completed)   Screening for metabolic disorder       Relevant Orders   HgB A1c   Lipid Profile       Routine preventative health maintenance measures emphasized. Please refer to After Visit Summary for  other counseling recommendations.   Return in 1 year (on 05/18/2022) for Annual Wellness Visit.    Total face-to-face time with patient: 15 minutes.  Over 50% of encounter was spent on counseling and coordination of care.   Venora Maples, MD/MPH Attending Family Medicine Physician, [facility] for [facility], [facility]

## 2021-05-26 ENCOUNTER — Other Ambulatory Visit: Payer: 59

## 2021-05-26 ENCOUNTER — Other Ambulatory Visit: Payer: Self-pay

## 2021-05-26 DIAGNOSIS — Z13228 Encounter for screening for other metabolic disorders: Secondary | ICD-10-CM

## 2021-05-27 LAB — LIPID PANEL
Chol/HDL Ratio: 2.4 ratio (ref 0.0–4.4)
Cholesterol, Total: 145 mg/dL (ref 100–199)
HDL: 61 mg/dL (ref >39)
LDL Chol Calc (NIH): 77 mg/dL (ref 0–99)
Triglycerides: 24 mg/dL (ref 0–149)
VLDL Cholesterol Cal: 7 mg/dL (ref 5–40)

## 2021-05-27 LAB — HEMOGLOBIN A1C
Est. average glucose Bld gHb Est-mCnc: 105 mg/dL
Hgb A1c MFr Bld: 5.3 % (ref 4.8–5.6)

## 2022-03-16 IMAGING — MG MM DIGITAL SCREENING BILAT W/ TOMO AND CAD
8 series · 9 of 24 positions shown · non-contrast
Comparison: Previous exam(s).

CLINICAL DATA: Screening.

EXAM:
DIGITAL SCREENING BILATERAL MAMMOGRAM WITH TOMOSYNTHESIS AND CAD
TECHNIQUE: Bilateral screening digital craniocaudal and mediolateral oblique
mammograms were obtained. Bilateral screening digital breast
tomosynthesis was performed. The images were evaluated with
computer-aided detection.

[L MLO synth-2D]
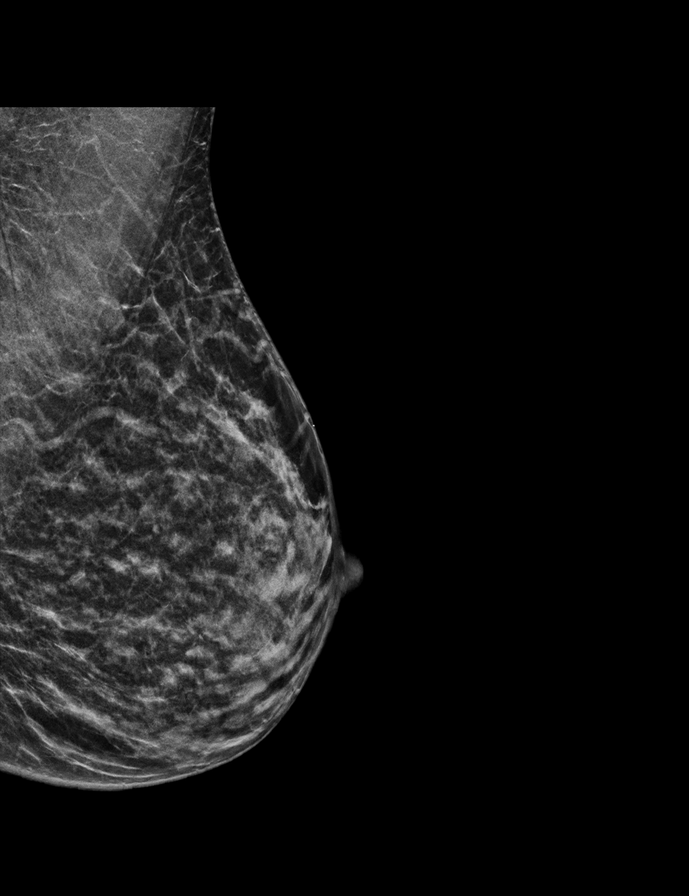

[R MLO synth-2D]
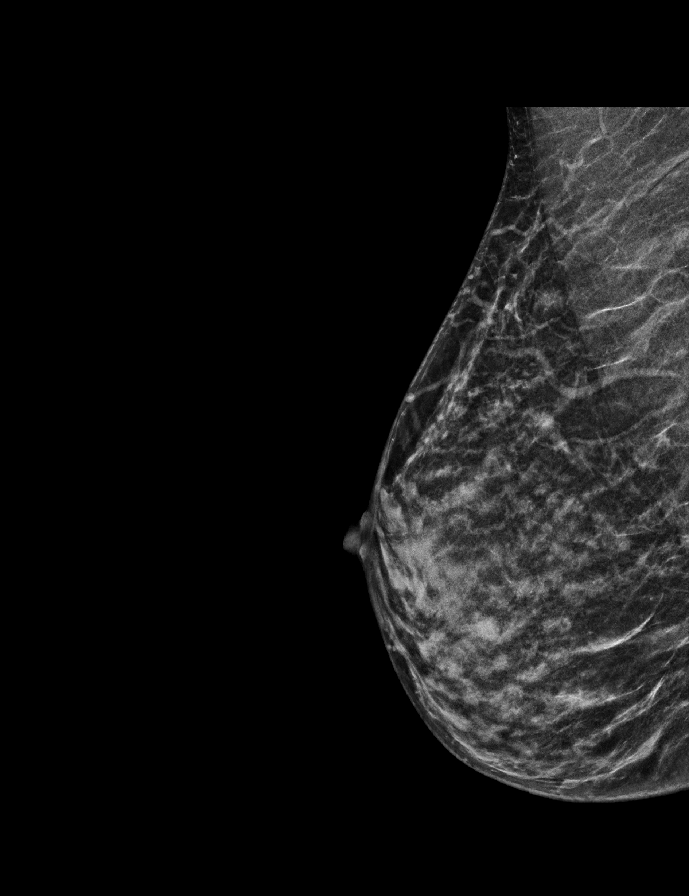

[L CC synth-2D]
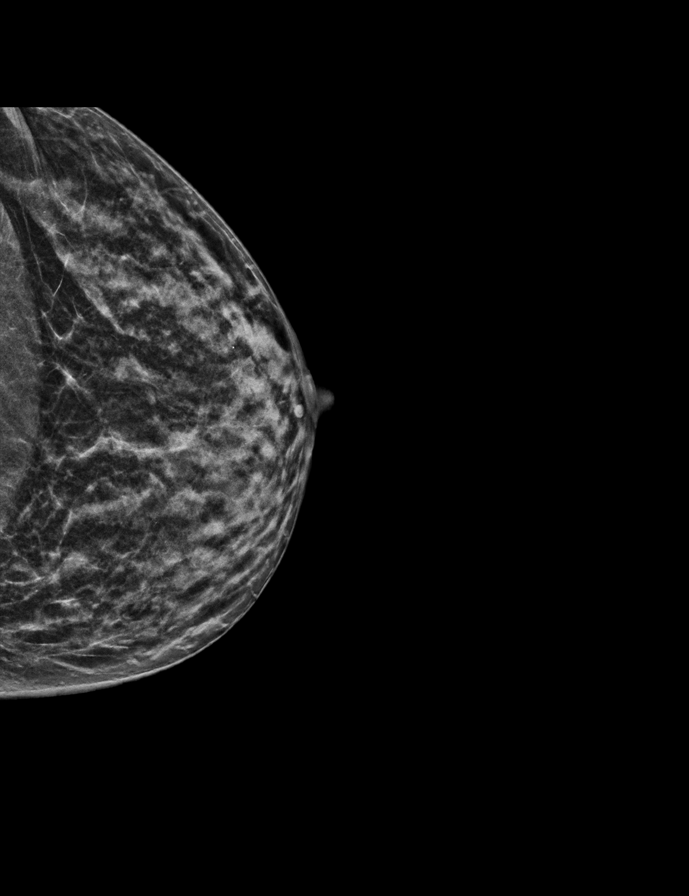

[R CC synth-2D]
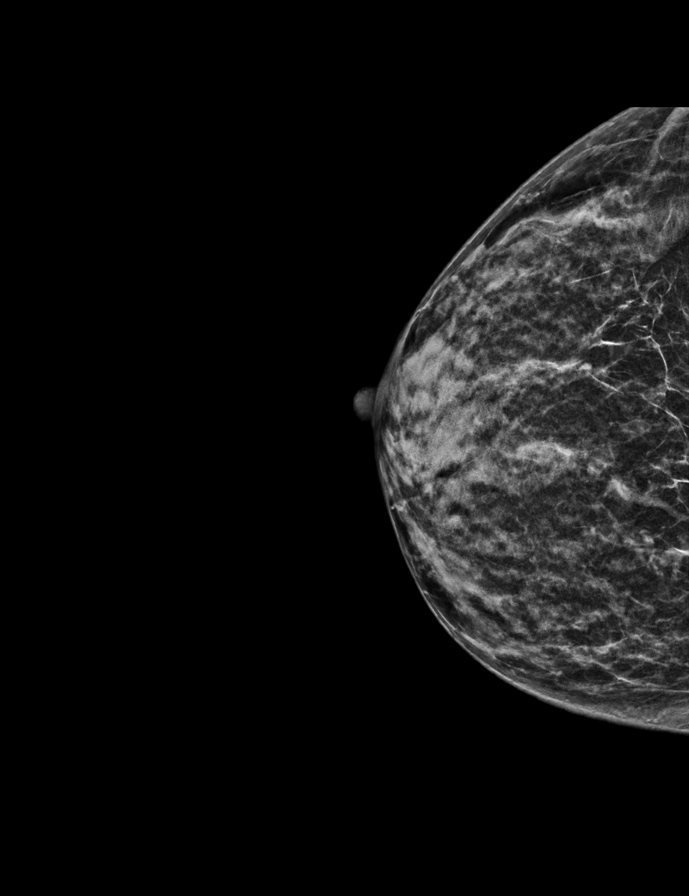

[L CC tomo · 2 of 42 frames shown]
[frame 14/42]
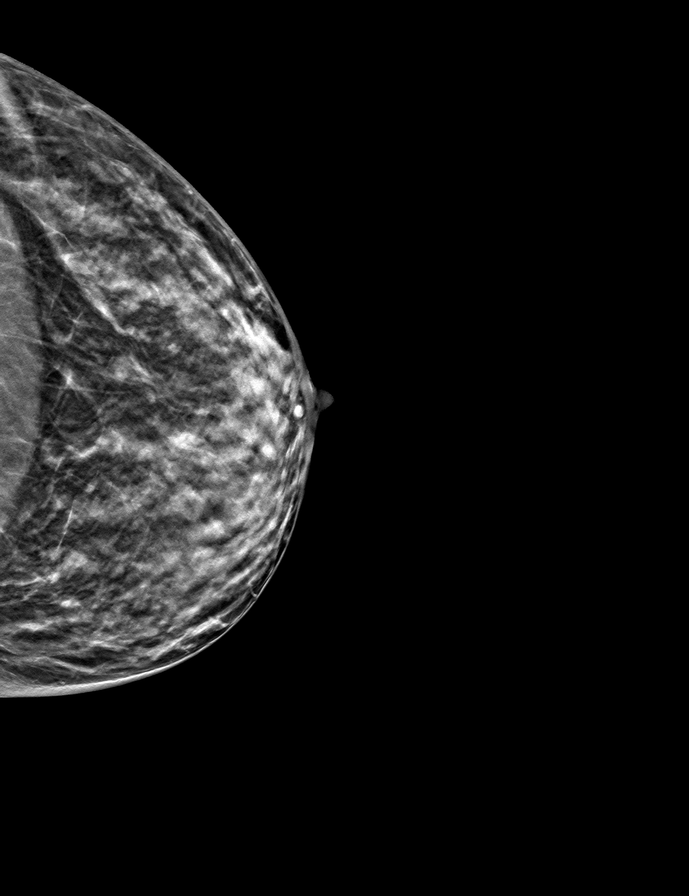
[frame 21/42]
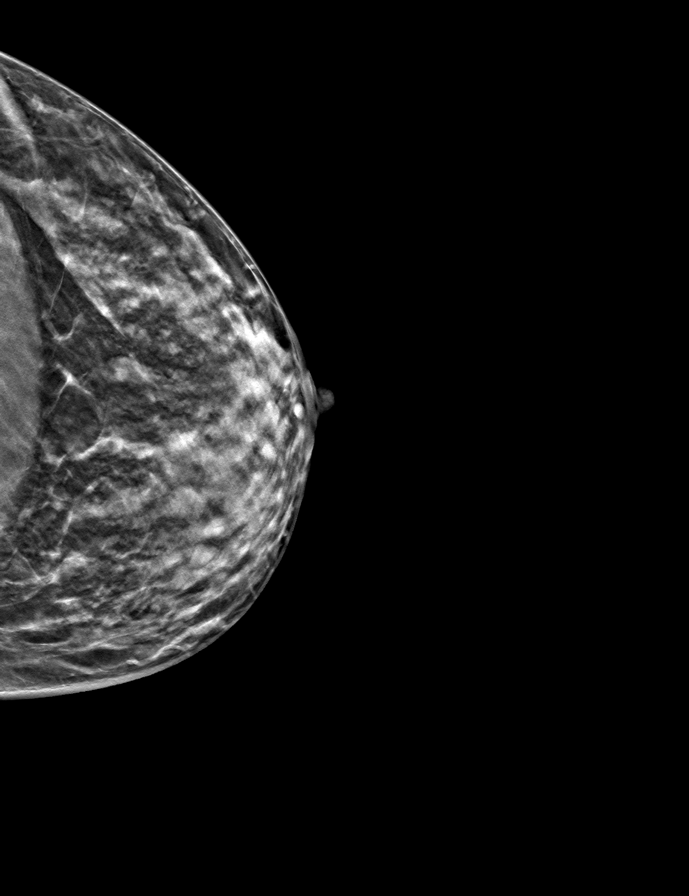

[L MLO tomo · tomo slice 21/40.0]
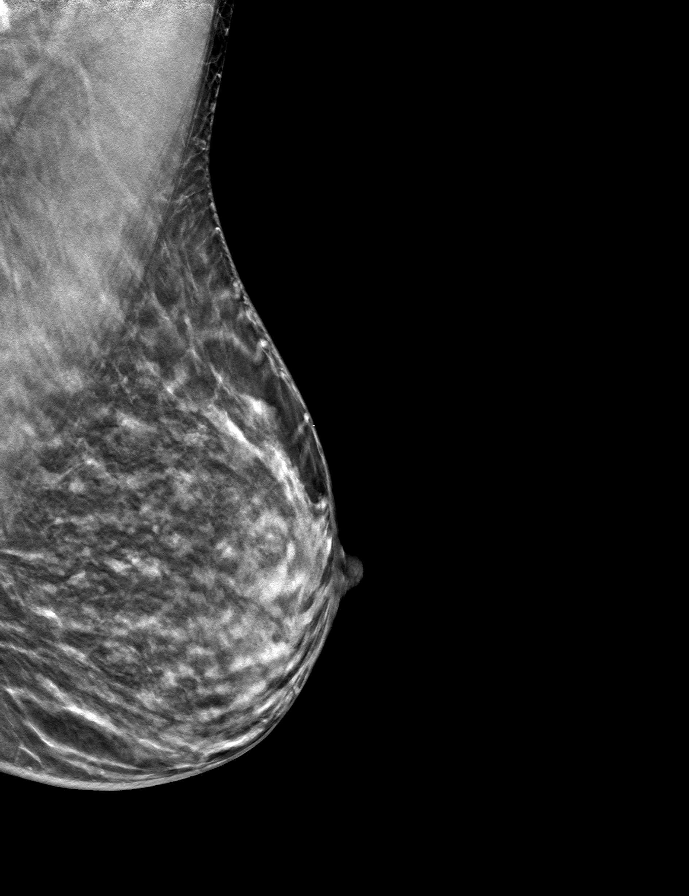

[R CC tomo · tomo slice 20/39.0]
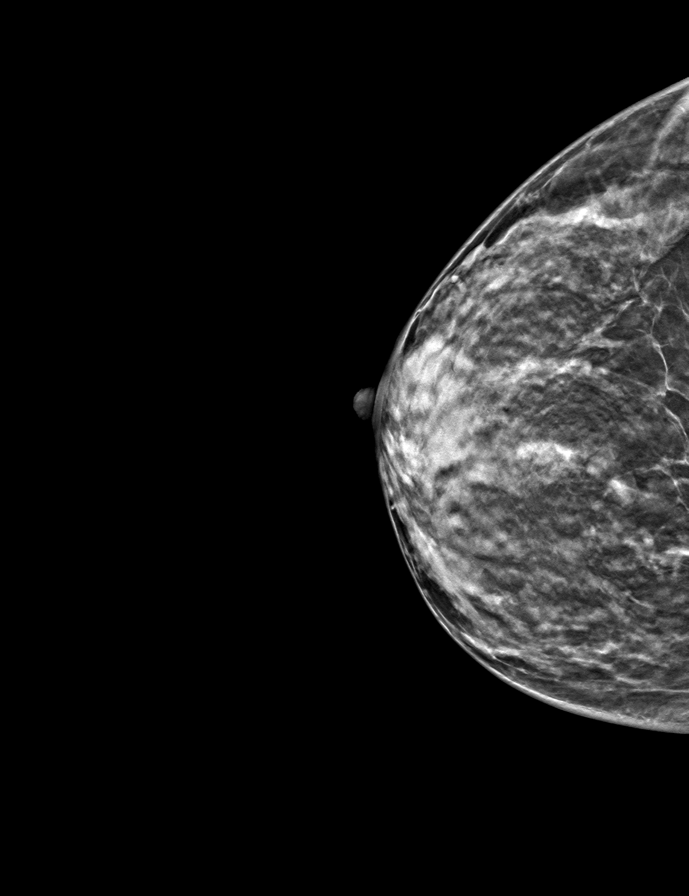

[R MLO tomo · tomo slice 21/41.0]
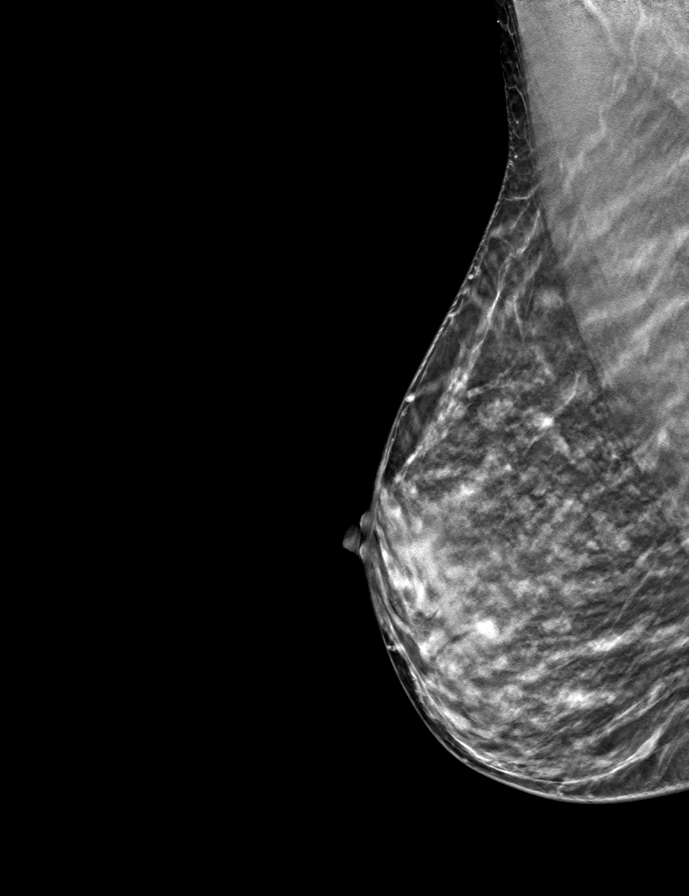

[9 of 24 positions shown; findings below may reference images not displayed]

ACR Breast Density Category c: The breast tissue is heterogeneously
dense, which may obscure small masses.
FINDINGS: There are no findings suspicious for malignancy.
IMPRESSION: No mammographic evidence of malignancy. A result letter of this
screening mammogram will be mailed directly to the patient.

RECOMMENDATION:
Screening mammogram in one year. (Code:Q3-W-BC3)

BI-RADS CATEGORY  1: Negative.

## 2022-05-11 ENCOUNTER — Other Ambulatory Visit: Payer: Self-pay | Admitting: Obstetrics and Gynecology

## 2022-05-11 DIAGNOSIS — Z1231 Encounter for screening mammogram for malignant neoplasm of breast: Secondary | ICD-10-CM

## 2022-06-30 ENCOUNTER — Ambulatory Visit
Admission: RE | Admit: 2022-06-30 | Discharge: 2022-06-30 | Disposition: A | Discharge status: Home / Self Care | Payer: 59 | Source: Ambulatory Visit | Attending: Obstetrics and Gynecology | Admitting: Obstetrics and Gynecology

## 2022-06-30 DIAGNOSIS — Z1231 Encounter for screening mammogram for malignant neoplasm of breast: Secondary | ICD-10-CM
